# Patient Record
Sex: Female | Born: 1945
Health system: Southern US, Community
[De-identification: ages and names within clinical notes are randomized; demographics above are authoritative.]

## PROBLEM LIST (undated history)

## (undated) DIAGNOSIS — G588 Other specified mononeuropathies: Secondary | ICD-10-CM

## (undated) DIAGNOSIS — I10 Essential (primary) hypertension: Secondary | ICD-10-CM

## (undated) DIAGNOSIS — E785 Hyperlipidemia, unspecified: Secondary | ICD-10-CM

## (undated) DIAGNOSIS — T753XXA Motion sickness, initial encounter: Secondary | ICD-10-CM

## (undated) DIAGNOSIS — E079 Disorder of thyroid, unspecified: Secondary | ICD-10-CM

## (undated) DIAGNOSIS — K219 Gastro-esophageal reflux disease without esophagitis: Secondary | ICD-10-CM

## (undated) DIAGNOSIS — E7800 Pure hypercholesterolemia, unspecified: Secondary | ICD-10-CM

## (undated) HISTORY — DX: Essential (primary) hypertension: I10

## (undated) HISTORY — DX: Disorder of thyroid, unspecified: E07.9

## (undated) HISTORY — PX: BUNIONECTOMY: SHX129

## (undated) HISTORY — DX: Pure hypercholesterolemia, unspecified: E78.00

## (undated) HISTORY — DX: Other specified mononeuropathies: G58.8

## (undated) HISTORY — DX: Hyperlipidemia, unspecified: E78.5

## (undated) HISTORY — PX: COLONOSCOPY: SHX174

## (undated) HISTORY — PX: CHOLECYSTECTOMY: SHX55

## (undated) HISTORY — PX: ABDOMINAL HYSTERECTOMY: SHX81

---

## 2008-02-19 ENCOUNTER — Ambulatory Visit: Payer: Self-pay | Admitting: Gastroenterology

## 2009-07-26 ENCOUNTER — Encounter: Payer: Self-pay | Admitting: Family Medicine

## 2009-08-04 ENCOUNTER — Encounter: Payer: Self-pay | Admitting: Family Medicine

## 2009-08-10 ENCOUNTER — Ambulatory Visit: Payer: Self-pay | Admitting: Endocrinology

## 2010-01-05 ENCOUNTER — Ambulatory Visit: Payer: Self-pay | Admitting: Obstetrics & Gynecology

## 2010-01-06 ENCOUNTER — Encounter: Payer: Self-pay | Admitting: Obstetrics & Gynecology

## 2010-01-19 ENCOUNTER — Ambulatory Visit: Payer: Self-pay | Admitting: Obstetrics & Gynecology

## 2010-02-10 ENCOUNTER — Encounter: Payer: Self-pay | Admitting: Family Medicine

## 2010-02-15 ENCOUNTER — Encounter: Payer: Self-pay | Admitting: Family Medicine

## 2010-03-03 ENCOUNTER — Encounter: Admission: RE | Admit: 2010-03-03 | Discharge: 2010-03-03 | Payer: Self-pay | Admitting: Obstetrics & Gynecology

## 2010-05-11 ENCOUNTER — Ambulatory Visit: Payer: Self-pay | Admitting: Family Medicine

## 2010-05-11 DIAGNOSIS — G43909 Migraine, unspecified, not intractable, without status migrainosus: Secondary | ICD-10-CM | POA: Insufficient documentation

## 2010-05-11 DIAGNOSIS — E0590 Thyrotoxicosis, unspecified without thyrotoxic crisis or storm: Secondary | ICD-10-CM

## 2010-05-11 DIAGNOSIS — E785 Hyperlipidemia, unspecified: Secondary | ICD-10-CM | POA: Insufficient documentation

## 2010-05-11 DIAGNOSIS — I10 Essential (primary) hypertension: Secondary | ICD-10-CM | POA: Insufficient documentation

## 2010-05-11 DIAGNOSIS — N952 Postmenopausal atrophic vaginitis: Secondary | ICD-10-CM

## 2010-05-12 ENCOUNTER — Ambulatory Visit: Payer: Self-pay | Admitting: Family Medicine

## 2010-05-12 LAB — CONVERTED CEMR LAB: T3, Free: 3.4 pg/mL (ref 2.3–4.2)

## 2010-05-15 LAB — CONVERTED CEMR LAB
AST: 20 units/L (ref 0–37)
Albumin: 4 g/dL (ref 3.5–5.2)
BUN: 20 mg/dL (ref 6–23)
Calcium: 9.4 mg/dL (ref 8.4–10.5)
Cholesterol: 131 mg/dL (ref 0–200)
Creatinine, Ser: 0.9 mg/dL (ref 0.4–1.2)
Free T4: 1.6 ng/dL (ref 0.6–1.6)
GFR calc non Af Amer: 71.53 mL/min (ref >60)
Glucose, Bld: 88 mg/dL (ref 70–99)
HDL: 37.7 mg/dL — ABNORMAL LOW (ref >39.00)
LDL Cholesterol: 71 mg/dL (ref 0–99)
Potassium: 3.9 meq/L (ref 3.5–5.1)
TSH: 0.07 microintl units/mL — ABNORMAL LOW (ref 0.35–5.50)
Triglycerides: 111 mg/dL (ref 0.0–149.0)
VLDL: 22.2 mg/dL (ref 0.0–40.0)

## 2010-07-13 ENCOUNTER — Ambulatory Visit: Payer: Self-pay | Admitting: Obstetrics & Gynecology

## 2010-07-14 ENCOUNTER — Encounter: Payer: Self-pay | Admitting: Obstetrics & Gynecology

## 2010-07-14 LAB — CONVERTED CEMR LAB: Trich, Wet Prep: NONE SEEN

## 2010-09-14 ENCOUNTER — Ambulatory Visit: Payer: Self-pay | Admitting: Family Medicine

## 2010-09-14 DIAGNOSIS — R05 Cough: Secondary | ICD-10-CM

## 2010-09-15 ENCOUNTER — Encounter: Payer: Self-pay | Admitting: Family Medicine

## 2010-09-15 LAB — CONVERTED CEMR LAB
Free T4: 1.14 ng/dL (ref 0.60–1.60)
T3, Free: 2.6 pg/mL (ref 2.3–4.2)

## 2010-10-18 ENCOUNTER — Ambulatory Visit: Payer: Self-pay | Admitting: Family Medicine

## 2010-10-18 DIAGNOSIS — N63 Unspecified lump in unspecified breast: Secondary | ICD-10-CM | POA: Insufficient documentation

## 2010-10-18 DIAGNOSIS — J0110 Acute frontal sinusitis, unspecified: Secondary | ICD-10-CM | POA: Insufficient documentation

## 2010-10-24 ENCOUNTER — Encounter: Admission: RE | Admit: 2010-10-24 | Discharge: 2010-10-24 | Payer: Self-pay | Admitting: Family Medicine

## 2010-11-06 ENCOUNTER — Telehealth: Payer: Self-pay | Admitting: Family Medicine

## 2010-11-14 ENCOUNTER — Ambulatory Visit: Payer: Self-pay | Admitting: Family Medicine

## 2010-11-14 DIAGNOSIS — R079 Chest pain, unspecified: Secondary | ICD-10-CM

## 2011-01-02 NOTE — Assessment & Plan Note (Signed)
Summary: NEW PT TO EST/CLE   Vital Signs:  Patient profile:   65 year old female Height:      68 inches Weight:      182.38 pounds BMI:     27.83 Temp:     98.6 degrees F oral Pulse rate:   80 / minute Pulse rhythm:   regular BP sitting:   102 / 70  (left arm) Cuff size:   regular  Vitals Entered By: Linde Gillis CMA Duncan Dull) 2010-05-27 1:59 PM) CC: new patient   History of Present Illness: 65 yo here to establish care.  1.  Hypothyroidism- has been on Synthroid 137 micrograms daily for years.  Had lab work done in 01/2010, TSH was 0.179, T4 10.7.  Has felt a little warmer lately, no other symptoms.  Ordering MD did not adjust her synthroid dose.  2.  HLD- has been on Vytorin 10-40 mg.  Does have some mild myalgias, but could not tolerate others so ok with staying on current dose.  3.  HTN- has been on Spironolactone/HCTZ 25/25 for years.  No CP, SOB, LE edema or blurred vision.  4.  Migraine headaches- gets one per month, not typically assocaited with nausea, vomiting or photophobia.  Takes one Cafergot and it usually resolves.  5.  Vulvular itching- sees Dr. Marice Potter.  Placed on Vagifem 1 tablet twice weekly which has been helping.  Well woman- due for Zostavax.  UTD on all other prevention.  Preventive Screening-Counseling & Management  Caffeine-Diet-Exercise     Does Patient Exercise: yes      Drug Use:  no.    Current Medications (verified): 1)  Synthroid 137 Mcg Tabs (Levothyroxine Sodium) .... Take One Tablet By Mouth Daily 2)  Spironolactone-Hctz 25-25 Mg Tabs (Spironolactone-Hctz) .... Take Two Tablet By Mouth Once Daily 3)  Vytorin 10-40 Mg Tabs (Ezetimibe-Simvastatin) .... Take One Tablet By Mouth Once Daily 4)  Vagifem 10 Mcg Tabs (Estradiol) .... Take One Tablet By Mouth Once A Week 5)  Cafergot 1-100 Mg Tabs (Ergotamine-Caffeine) .... Take As Needed  Allergies (verified): 1)  ! Penicillin 2)  ! Sulfa 3)  ! Amoxicillin  Past History:  Family  History: Last updated: May 27, 2010 mother died of bone CA at 30 father died of CAD at 74 Family History of CAD Female 1st degree relative <50- sibblings  Social History: Last updated: 05/27/10 Retired, originally from [place]. No children. Married Regular exercise-yes Drug use-no  Risk Factors: Exercise: yes (05/27/10)  Past Medical History: Hyperlipidemia Hypertension Hyperthyroidism Migraine  Past Surgical History: Cholecystectomy Hysterectomy  Family History: Reviewed history and no changes required. mother died of bone CA at 40 father died of CAD at 8 Family History of CAD Female 1st degree relative <50- sibblings  Social History: Reviewed history and no changes required. Retired, originally from [place]. No children. Married Regular exercise-yes Drug use-no Does Patient Exercise:  yes Drug Use:  no  Review of Systems      See HPI General:  Denies fatigue and malaise. Eyes:  Denies blurring. ENT:  Denies difficulty swallowing. CV:  Denies bluish discoloration of lips or nails, chest pain or discomfort, and difficulty breathing at night. Resp:  Denies shortness of breath. GI:  Denies abdominal pain, bloody stools, and change in bowel habits. GU:  Denies abnormal vaginal bleeding and urinary frequency. MS:  Denies joint pain, joint redness, and joint swelling. Derm:  Denies rash. Neuro:  Denies headaches. Psych:  Denies anxiety and depression. Endo:  Complains  of heat intolerance. Heme:  Denies abnormal bruising.  Physical Exam  General:  Well-developed,well-nourished,in no acute distress; alert,appropriate and cooperative throughout examination Head:  Normocephalic and atraumatic without obvious abnormalities. No apparent alopecia or balding. Eyes:  vision grossly intact, pupils equal, and pupils round.   Ears:  R ear normal and L ear normal.   Nose:  no external deformity.   Mouth:  good dentition and no gingival abnormalities.   Neck:  No  deformities, masses, or tenderness noted. Lungs:  normal respiratory effort, no intercostal retractions, no accessory muscle use, no crackles, and no wheezes.   Heart:  normal rate, regular rhythm, and no murmur.   Abdomen:  Bowel sounds positive,abdomen soft and non-tender without masses, organomegaly or hernias noted. Msk:  No deformity or scoliosis noted of thoracic or lumbar spine.   Extremities:  No clubbing, cyanosis, edema, or deformity noted with normal full range of motion of all joints.   Neurologic:  alert & oriented X3.   Skin:  Intact without suspicious lesions or rashes Psych:  Cognition and judgment appear intact. Alert and cooperative with normal attention span and concentration. No apparent delusions, illusions, hallucinations   Impression & Recommendations:  Problem # 1:  HYPERTHYROIDISM (ICD-242.90) Assessment Deteriorated May need to decrease dose of Synthroid based on previous labs.  Will redraw TSH, T3, T4.  Problem # 2:  HYPERTENSION (ICD-401.9) Assessment: Unchanged Stable on current meds. Her updated medication list for this problem includes:    Spironolactone-hctz 25-25 Mg Tabs (Spironolactone-hctz) .Marland Kitchen... Take two tablet by mouth once daily  Problem # 3:  HYPERLIPIDEMIA (ICD-272.4) Assessment: Unchanged Recheck FLP today, may be able to decrease medication, perhaps remove Ezetimibe.  Changing lifestyle, has lost 13 pounds in 3 months. Her updated medication list for this problem includes:    Vytorin 10-40 Mg Tabs (Ezetimibe-simvastatin) .Marland Kitchen... Take one tablet by mouth once daily  Problem # 4:  VAGINITIS, ATROPHIC (ICD-627.3) Assessment: Unchanged Continue Vagifem per Dr. Marice Potter. Her updated medication list for this problem includes:    Vagifem 10 Mcg Tabs (Estradiol) .Marland Kitchen... Take one tablet by mouth once a week  Problem # 5:  MIGRAINE HEADACHE (ICD-346.90) Assessment: Unchanged Contine using Cafergot as abortive medicine only, does not currently require  migraine prophylaxis. Her updated medication list for this problem includes:    Cafergot 1-100 Mg Tabs (Ergotamine-caffeine) .Marland Kitchen... Take as needed  Complete Medication List: 1)  Synthroid 137 Mcg Tabs (Levothyroxine sodium) .... Take one tablet by mouth daily 2)  Spironolactone-hctz 25-25 Mg Tabs (Spironolactone-hctz) .... Take two tablet by mouth once daily 3)  Vytorin 10-40 Mg Tabs (Ezetimibe-simvastatin) .... Take one tablet by mouth once daily 4)  Vagifem 10 Mcg Tabs (Estradiol) .... Take one tablet by mouth once a week 5)  Cafergot 1-100 Mg Tabs (Ergotamine-caffeine) .... Take as needed  Patient Instructions: 1)  It was very nice to meet you, Ms. Susan Byrd. 2)  Please check with your insurance company to see if they cover Zostavax (shingles vaccine). 3)  Please make a fasting lab appointment tomorrow morning for TSH, T3, T4 (242.9), fasting lipiid, hepatic panel (272.4), BMET (401.9). Prescriptions: VYTORIN 10-40 MG TABS (EZETIMIBE-SIMVASTATIN) take one tablet by mouth once daily  #90 x 3   Entered and Authorized by:   Ruthe Mannan MD   Signed by:   Ruthe Mannan MD on 05/11/2010   Method used:   Print then Give to Patient   RxID:   [id number] VYTORIN 10-40 MG TABS (EZETIMIBE-SIMVASTATIN) take one tablet by  mouth once daily  #90 x 3   Entered and Authorized by:   Ruthe Mannan MD   Signed by:   Ruthe Mannan MD on 05/11/2010   Method used:   Electronically to        [facility] [phone]* (retail)       [address] [place], [place]  [address]       Ph: [phone] or [phone]       Fax: [phone]   RxID:   [id number]   Current Allergies (reviewed today): ! PENICILLIN ! SULFA ! AMOXICILLIN  Flex Sig Next Due:  Not Indicated Colonoscopy Result Date:  05/16/2007 Colonoscopy Result:  normal Colonoscopy Next Due:  10 yr Hemoccult Next Due:  Not Indicated PAP Next Due:  Not Indicated

## 2011-01-02 NOTE — Assessment & Plan Note (Signed)
Summary: ??sinus infection   Vital Signs:  Patient profile:   65 year old female Height:      68 inches Weight:      185 pounds BMI:     28.23 Temp:     98.6 degrees F oral Pulse rate:   64 / minute Pulse rhythm:   regular BP sitting:   112 / 82  (left arm) Cuff size:   regular  Vitals Entered By: Linde Gillis CMA Duncan Dull) (October 18, 2010 9:34 AM) CC: ? sinus infections, cough   History of Present Illness: 65 yo here for 2 week of progressive URI symptoms.  STarted with sneezing, runny nose, now has productive cough and left sided facial pressure. No fevers or chills. Dayquil not helping. No SOB.  Breast mass- felt a palpable mass in left breast several month ago, still there. Not growing in size that she is aware of but is sometimes painful to touch. Mammogram was normal in 03/2010.   No nipple changes or discharge. No night sweats or fevers.  Current Medications (verified): 1)  Spironolactone-Hctz 25-25 Mg Tabs (Spironolactone-Hctz) .... Take Two Tablet By Mouth Once Daily 2)  Vytorin 10-40 Mg Tabs (Ezetimibe-Simvastatin) .... Take One Tablet By Mouth Once Daily 3)  Vagifem 10 Mcg Tabs (Estradiol) .... Take One Tablet By Mouth Once A Week 4)  Cafergot 1-100 Mg Tabs (Ergotamine-Caffeine) .... 2 Tabs As Needed At Onset of Headache 5)  Synthroid 125 Mcg Tabs (Levothyroxine Sodium) .Marland Kitchen.. 1 Tab By Mouth Daily. 6)  Tessalon Perles 100 Mg  Caps (Benzonatate) .Marland Kitchen.. 1 Tab By Mouth Three Times A Day As Needed Cough 7)  Azithromycin 250 Mg  Tabs (Azithromycin) .... 2 By  Mouth Today and Then 1 Daily For 4 Days  Allergies: 1)  ! Penicillin 2)  ! Sulfa 3)  ! Amoxicillin  Past History:  Past Medical History: Last updated: 05-12-2010 Hyperlipidemia Hypertension Hyperthyroidism Migraine  Past Surgical History: Last updated: 2010-05-12 Cholecystectomy Hysterectomy  Family History: Last updated: 05/12/10 mother died of bone CA at 59 father died of CAD at 67 Family  History of CAD Female 1st degree relative <50- sibblings  Social History: Last updated: 05-12-10 Retired, originally from [place]. No children. Married Regular exercise-yes Drug use-no  Risk Factors: Exercise: yes (05-12-10)  Review of Systems      See HPI General:  Denies loss of appetite, malaise, weakness, and weight loss. ENT:  Complains of earache, nasal congestion, postnasal drainage, sinus pressure, and sore throat. CV:  Denies chest pain or discomfort. Resp:  Complains of cough and sputum productive; denies shortness of breath and wheezing.  Physical Exam  General:  Well-developed,well-nourished,in no acute distress; alert,appropriate and cooperative throughout examination VSS, non toxic appearing Ears:  TMs retracted bilaterally. Nose:  no airflow obstruction and mucosal erythema.   Left frontal sinuses TTP Mouth:  +PND Breasts:  skin/areolae normal and no abnormal thickening.   palpable 2-3 cm mass at 3 oclock, left breast Lungs:  normal respiratory effort, no intercostal retractions, no accessory muscle use, no crackles, and no wheezes.   Heart:  normal rate, regular rhythm, and no murmur.   Extremities:  No clubbing, cyanosis, edema, or deformity noted with normal full range of motion of all joints.   Psych:  Cognition and judgment appear intact. Alert and cooperative with normal attention span and concentration. No apparent delusions, illusions, hallucinations   Impression & Recommendations:  Problem # 1:  ACUTE FRONTAL SINUSITIS (ICD-461.1) Assessment New Given duration and progression of  symptoms, will treat with Zpack. Tessalon as needed cough. Her updated medication list for this problem includes:    Tessalon Perles 100 Mg Caps (Benzonatate) .Marland Kitchen... 1 tab by mouth three times a day as needed cough    Azithromycin 250 Mg Tabs (Azithromycin) .Marland Kitchen... 2 by  mouth today and then 1 daily for 4 days  Problem # 2:  LUMP OR MASS IN BREAST  (ZOX-096.04) Assessment: New Refer for diagnostic mammogram to evaluate further. Orders: Radiology Referral (Radiology)  Complete Medication List: 1)  Spironolactone-hctz 25-25 Mg Tabs (Spironolactone-hctz) .... Take two tablet by mouth once daily 2)  Vytorin 10-40 Mg Tabs (Ezetimibe-simvastatin) .... Take one tablet by mouth once daily 3)  Vagifem 10 Mcg Tabs (Estradiol) .... Take one tablet by mouth once a week 4)  Cafergot 1-100 Mg Tabs (Ergotamine-caffeine) .... 2 tabs as needed at onset of headache 5)  Synthroid 125 Mcg Tabs (Levothyroxine sodium) .Marland Kitchen.. 1 tab by mouth daily. 6)  Tessalon Perles 100 Mg Caps (Benzonatate) .Marland Kitchen.. 1 tab by mouth three times a day as needed cough 7)  Azithromycin 250 Mg Tabs (Azithromycin) .... 2 by  mouth today and then 1 daily for 4 days  Patient Instructions: 1)  Please stop by to see Shirlee Limerick on your way out. 2)  Get plenty of rest, drink lots of clear liquids, and use Tylenol or Ibuprofen for fever and comfort. Zpack as prescribed.  Tessalon as needed for cough, sudafed can help with ear pressure. Return in 7-10 days if you're not better: sooner if you'er feeling worse.  Prescriptions: AZITHROMYCIN 250 MG  TABS (AZITHROMYCIN) 2 by  mouth today and then 1 daily for 4 days  #6 x 0   Entered and Authorized by:   Ruthe Mannan MD   Signed by:   Ruthe Mannan MD on 10/18/2010   Method used:   Print then Give to Patient   RxID:   [id number] CAFERGOT 1-100 MG TABS (ERGOTAMINE-CAFFEINE) 2 tabs as needed at onset of headache  #60 x 3   Entered and Authorized by:   Ruthe Mannan MD   Signed by:   Ruthe Mannan MD on 10/18/2010   Method used:   Print then Give to Patient   RxID:   [id number] TESSALON PERLES 100 MG  CAPS (BENZONATATE) 1 tab by mouth three times a day as needed cough  #30 x 0   Entered and Authorized by:   Ruthe Mannan MD   Signed by:   Ruthe Mannan MD on 10/18/2010   Method used:   [facility] to        [facility] [phone]* (retail)        [address] [place], [place]  [address]       Ph: [phone] or [phone]       Fax: [phone]   RxID:   [id number]    Orders Added: 1)  Radiology Referral [Radiology] 2)  Est. Patient Level IV [[address]]    Current Allergies (reviewed today): ! PENICILLIN ! SULFA ! AMOXICILLIN

## 2011-01-02 NOTE — Progress Notes (Signed)
Summary: Alternative for Cafergot  Phone Note From Pharmacy Call back at [phone] option 3 fax# [phone]   Caller: [facility] Call For: Dr. Dayton Martes  Summary of Call: Received fax form from pharmacy stating that Cafergot is discontinued and all similar products are off the market.  Called patient and left a message on machine at home for her to return call.  Dr. Dayton Martes wants to know what other medications she has tried and what has worked well for her in the past.  Linde Gillis CMA Duncan Dull)  November 06, 2010 3:10 PM   Left message on machine at home for patient to return call.  Linde Gillis CMA Duncan Dull)  November 07, 2010 11:40 AM   Patient advised as instructed via telephone.  She agrees to try something that along the same lines of Cafergot if possible but is willing to try anything. Initial call taken by: Linde Gillis CMA Duncan Dull),  November 07, 2010 3:39 PM  Follow-up for Phone Call        there is nothing similar on the market.  we would need to go to something like imitrex. Ruthe Mannan MD  November 08, 2010 7:21 AM  Patient is willing to try any other medication.  Linde Gillis CMA Duncan Dull)  November 08, 2010 7:43 AM   Rx called to [facility] and patient notified via message left on machine at home number. Follow-up by: Linde Gillis CMA Duncan Dull),  November 08, 2010 8:37 AM    New/Updated Medications: IMITREX 100 MG TABS (SUMATRIPTAN SUCCINATE) 1 by mouth at the onset of migraine. May repeat dose in one hour if headache not resolved Prescriptions: IMITREX 100 MG TABS (SUMATRIPTAN SUCCINATE) 1 by mouth at the onset of migraine. May repeat dose in one hour if headache not resolved  #10 x 3   Entered and Authorized by:   Ruthe Mannan MD   Signed by:   Ruthe Mannan MD on 11/08/2010   Method used:   Telephoned to ...         RxID:   [id number]

## 2011-01-02 NOTE — Assessment & Plan Note (Signed)
Summary: COUGH/FLU SHOT/SHINGLES SHOT/LAB WORK/DLO   Vital Signs:  Patient profile:   65 year old female Height:      68 inches Weight:      184 pounds BMI:     28.08 Temp:     97.8 degrees F oral Pulse rate:   60 / minute Pulse rhythm:   regular BP sitting:   102 / 80  (left arm) Cuff size:   regular  Vitals Entered By: Linde Gillis CMA Duncan Dull) (September 14, 2010 8:10 AM) CC: cough, ? flu shot, ? shingles shot   History of Present Illness: 65 yo here for follow up.   1.  Hypothyroidism- decreased her synthroid to 125 micrograms daily in 05/2010.  Feels better.  Denies any symptoms of hypo or hyperthyroidism.  2.  Cough- had a productive cough last week.  At one point, coughed so hard that she had chest pain that radiated to her jaw and eye.  Resolved 10 minutes later.  No residual deficits.  One of her friends told her that it might have been trigeminal neuralgia.  She does not hat have a runny nose, fever, sore throat, or ear pain.  She would like her shingles and flu shots today.  Current Medications (verified): 1)  Spironolactone-Hctz 25-25 Mg Tabs (Spironolactone-Hctz) .... Take Two Tablet By Mouth Once Daily 2)  Vytorin 10-40 Mg Tabs (Ezetimibe-Simvastatin) .... Take One Tablet By Mouth Once Daily 3)  Vagifem 10 Mcg Tabs (Estradiol) .... Take One Tablet By Mouth Once A Week 4)  Cafergot 1-100 Mg Tabs (Ergotamine-Caffeine) .... Take As Needed 5)  Synthroid 125 Mcg Tabs (Levothyroxine Sodium) .Marland Kitchen.. 1 Tab By Mouth Daily.  Allergies: 1)  ! Penicillin 2)  ! Sulfa 3)  ! Amoxicillin  Past History:  Past Medical History: Last updated: 05/21/2010 Hyperlipidemia Hypertension Hyperthyroidism Migraine  Past Surgical History: Last updated: 05/21/2010 Cholecystectomy Hysterectomy  Family History: Last updated: May 21, 2010 mother died of bone CA at 56 father died of CAD at 69 Family History of CAD Female 1st degree relative <50- sibblings  Social History: Last updated:  05/21/2010 Retired, originally from [place]. No children. Married Regular exercise-yes Drug use-no  Risk Factors: Exercise: yes (05-21-2010)  Review of Systems      See HPI General:  Denies fever. CV:  Denies chest pain or discomfort. Resp:  Complains of cough; denies wheezing. Endo:  Denies cold intolerance, heat intolerance, and weight change.  Physical Exam  General:  Well-developed,well-nourished,in no acute distress; alert,appropriate and cooperative throughout examination Head:  Normocephalic and atraumatic without obvious abnormalities. No apparent alopecia or balding. Eyes:  vision grossly intact, pupils equal, and pupils round.   Ears:  R ear normal and L ear normal.   Nose:  no external deformity.   Mouth:  good dentition and no gingival abnormalities.   Neck:  No deformities, masses, or tenderness noted. Lungs:  normal respiratory effort, no intercostal retractions, no accessory muscle use, no crackles, and no wheezes.   Heart:  normal rate, regular rhythm, and no murmur.   Abdomen:  Bowel sounds positive,abdomen soft and non-tender without masses, organomegaly or hernias noted. Msk:  No deformity or scoliosis noted of thoracic or lumbar spine.   Extremities:  No clubbing, cyanosis, edema, or deformity noted with normal full range of motion of all joints.   Neurologic:  alert & oriented X3.   Skin:  Intact without suspicious lesions or rashes Psych:  Cognition and judgment appear intact. Alert and cooperative with normal attention span and  concentration. No apparent delusions, illusions, hallucinations   Impression & Recommendations:  Problem # 1:  HYPERTHYROIDISM (ICD-242.90) Assessment Unchanged recheck thyroid panel today. Orders: Venipuncture (19147) TLB-TSH (Thyroid Stimulating Hormone) (84443-TSH) TLB-T4 (Thyrox), Free [phone]) TLB-T3, Free (Triiodothyronine) (84481-T3FREE)  Problem # 2:  COUGH (ICD-786.2) Assessment: New Resolving, likely  viral.  Lung exam clear.  Reassured her that I do not think she has Trigeminal neuralgia.  Perhaps had spams of SCM muscle group?  Advised to let me know if symptoms return.  Complete Medication List: 1)  Spironolactone-hctz 25-25 Mg Tabs (Spironolactone-hctz) .... Take two tablet by mouth once daily 2)  Vytorin 10-40 Mg Tabs (Ezetimibe-simvastatin) .... Take one tablet by mouth once daily 3)  Vagifem 10 Mcg Tabs (Estradiol) .... Take one tablet by mouth once a week 4)  Cafergot 1-100 Mg Tabs (Ergotamine-caffeine) .... Take as needed 5)  Synthroid 125 Mcg Tabs (Levothyroxine sodium) .Marland Kitchen.. 1 tab by mouth daily.  Current Allergies (reviewed today): ! PENICILLIN ! SULFA ! AMOXICILLIN  Appended Document: COUGH/FLU SHOT/SHINGLES SHOT/LAB WORK/DLO     Allergies: 1)  ! Penicillin 2)  ! Sulfa 3)  ! Amoxicillin   Complete Medication List: 1)  Spironolactone-hctz 25-25 Mg Tabs (Spironolactone-hctz) .... Take two tablet by mouth once daily 2)  Vytorin 10-40 Mg Tabs (Ezetimibe-simvastatin) .... Take one tablet by mouth once daily 3)  Vagifem 10 Mcg Tabs (Estradiol) .... Take one tablet by mouth once a week 4)  Cafergot 1-100 Mg Tabs (Ergotamine-caffeine) .... Take as needed 5)  Synthroid 125 Mcg Tabs (Levothyroxine sodium) .Marland Kitchen.. 1 tab by mouth daily.  Other Orders: Admin 1st Vaccine (86578) Flu Vaccine 49yrs + [phone]) Zoster (Shingles) Vaccine Live [phone]) Admin of Any Addtl Vaccine (13244)  Flu Vaccine Consent Questions     Do you have a history of severe allergic reactions to this vaccine? no    Any prior history of allergic reactions to egg and/or gelatin? no    Do you have a sensitivity to the preservative Thimersol? no    Do you have a past history of Guillan-Barre Syndrome? no    Do you currently have an acute febrile illness? no    Have you ever had a severe reaction to latex? no    Vaccine information given and explained to patient? yes    Are you currently pregnant? no    Lot  Number:[id number]   Exp Date:06/02/2011   Site Given  Left Deltoid IMlu    Immunizations Administered:  Zostavax # 1:    Vaccine Type: Zostavax    Site: left deltoid    Mfr: Merck    Dose: 0.97ml    Route: [place]    Given by: Linde Gillis CMA (AAMA)    Exp. Date: 07/21/2011    Lot #: [id number]    VIS given: 09/14/05 given September 14, 2010.

## 2011-01-02 NOTE — Letter (Signed)
Summary: French Ana MD  French Ana MD   Imported By: Lester [place] 05/22/2010 12:36:28  _____________________________________________________________________  External Attachment:    Type:   Image     Comment:   External Document

## 2011-01-02 NOTE — Letter (Signed)
Summary: Generic Letter   at [facility]  [address] [place], [place] [address]   Phone: [phone]  Fax: [phone]    09/15/2010  [facility] [address] [place], [place]  [address]  Dear Ms. Chaffin,    We have received your lab results back and Dr. Dayton Martes says that your thyroid studies are normal.  Enclosed is a copy of your lab results.       Sincerely,        Linde Gillis CMA (AAMA)for Dr. Ruthe Mannan

## 2011-01-04 NOTE — Assessment & Plan Note (Signed)
Summary: fell last Wednesday, pain in sternum area that comes and goes   Vital Signs:  Patient profile:   65 year old female Height:      68 inches Weight:      186 pounds BMI:     28.38 Temp:     98.6 degrees F oral Pulse rate:   68 / minute Pulse rhythm:   regular BP sitting:   122 / 70  (right arm) Cuff size:   regular  Vitals Entered By: Linde Gillis CMA Duncan Dull) (November 14, 2010 12:12 PM) CC: pain in sternum area from fall   History of Present Illness: 65 yo here for sternal and rib pain.  Slipped on wet step on [place], fell and landed on her bottom. A few minutes later, felt chest soreness. Gradually getting better but hurts when she takes deep breaths. Started in mid chest, now more under breasts bilaterally. No SOB.  No LOC although she did hit her head.  Current Medications (verified): 1)  Spironolactone-Hctz 25-25 Mg Tabs (Spironolactone-Hctz) .... Take Two Tablet By Mouth Once Daily 2)  Vytorin 10-40 Mg Tabs (Ezetimibe-Simvastatin) .... Take One Tablet By Mouth Once Daily 3)  Vagifem 10 Mcg Tabs (Estradiol) .... Take One Tablet By Mouth Once A Week 4)  Cafergot 1-100 Mg Tabs (Ergotamine-Caffeine) .... 2 Tabs As Needed At Onset of Headache 5)  Synthroid 125 Mcg Tabs (Levothyroxine Sodium) .Marland Kitchen.. 1 Tab By Mouth Daily. 6)  Tessalon Perles 100 Mg  Caps (Benzonatate) .Marland Kitchen.. 1 Tab By Mouth Three Times A Day As Needed Cough 7)  Imitrex 100 Mg Tabs (Sumatriptan Succinate) .Marland Kitchen.. 1 By Mouth At The Onset of Migraine. May Repeat Dose in One Hour If Headache Not Resolved  Allergies: 1)  ! Penicillin 2)  ! Sulfa 3)  ! Amoxicillin  Past History:  Past Medical History: Last updated: 2010/05/25 Hyperlipidemia Hypertension Hyperthyroidism Migraine  Past Surgical History: Last updated: 05-25-2010 Cholecystectomy Hysterectomy  Family History: Last updated: 05/25/2010 mother died of bone CA at 37 father died of CAD at 68 Family History of CAD Female 1st degree relative <50-  sibblings  Social History: Last updated: 05/25/10 Retired, originally from [place]. No children. Married Regular exercise-yes Drug use-no  Risk Factors: Exercise: yes (May 25, 2010)  Review of Systems      See HPI CV:  Denies near fainting, palpitations, and shortness of breath with exertion. Resp:  Denies shortness of breath.  Physical Exam  General:  Well-developed,well-nourished,in no acute distress; alert,appropriate and cooperative throughout examination VSS, non toxic appearing Chest Wall:  TTP over bilateral inferior rib cage, costochondrial tenderness.   Lungs:  normal respiratory effort, no intercostal retractions, no accessory muscle use, no crackles, and no wheezes.   Heart:  normal rate, regular rhythm, and no murmur.   Psych:  Cognition and judgment appear intact. Alert and cooperative with normal attention span and concentration. No apparent delusions, illusions, hallucinations   Impression & Recommendations:  Problem # 1:  RIB PAIN (ICD-786.50) Assessment New Seems most consistent with costochondriitis. Will get xray to rule out rib fractures. Continue supportive care as per pt instructions. Orders: T-Ribs Bilateral 4 Views [phone])  Complete Medication List: 1)  Spironolactone-hctz 25-25 Mg Tabs (Spironolactone-hctz) .... Take two tablet by mouth once daily 2)  Vytorin 10-40 Mg Tabs (Ezetimibe-simvastatin) .... Take one tablet by mouth once daily 3)  Vagifem 10 Mcg Tabs (Estradiol) .... Take one tablet by mouth once a week 4)  Cafergot 1-100 Mg Tabs (Ergotamine-caffeine) .... 2 tabs as needed  at onset of headache 5)  Synthroid 125 Mcg Tabs (Levothyroxine sodium) .Marland Kitchen.. 1 tab by mouth daily. 6)  Tessalon Perles 100 Mg Caps (Benzonatate) .Marland Kitchen.. 1 tab by mouth three times a day as needed cough 7)  Imitrex 100 Mg Tabs (Sumatriptan succinate) .Marland Kitchen.. 1 by mouth at the onset of migraine. may repeat dose in one hour if headache not resolved  Patient  Instructions: 1)  You can take up to 800 mg of Advil three times a day. 2)  Take 650 - 1000 mg of tylenol every 4-6 hours as needed for relief of pain or comfort. Avoid taking more than 4000 mg in a 24 hour period( can cause liver damage in higher doses).    Orders Added: 1)  T-Ribs Bilateral 4 Views [71111TC] 2)  Est. Patient Level IV [04540]    Current Allergies (reviewed today): ! PENICILLIN ! SULFA ! AMOXICILLIN

## 2011-03-06 ENCOUNTER — Other Ambulatory Visit: Payer: Self-pay | Admitting: *Deleted

## 2011-03-06 MED ORDER — SPIRONOLACTONE-HCTZ 25-25 MG PO TABS: ORAL_TABLET | ORAL | Status: DC

## 2011-03-06 NOTE — Telephone Encounter (Signed)
In my box

## 2011-03-06 NOTE — Telephone Encounter (Signed)
Order faxed to [facility] at [phone].

## 2011-03-06 NOTE — Telephone Encounter (Signed)
Pt needs order for labs to be faxed to [facility], she has an appt to see next week week, so needs this as soon as possible.

## 2011-03-08 ENCOUNTER — Encounter: Payer: Self-pay | Admitting: Family Medicine

## 2011-03-08 LAB — HM SIGMOIDOSCOPY

## 2011-03-08 LAB — HM COLONOSCOPY

## 2011-03-08 LAB — HM PAP SMEAR

## 2011-03-13 ENCOUNTER — Ambulatory Visit (INDEPENDENT_AMBULATORY_CARE_PROVIDER_SITE_OTHER): Payer: PRIVATE HEALTH INSURANCE | Admitting: Family Medicine

## 2011-03-13 ENCOUNTER — Encounter: Payer: Self-pay | Admitting: Family Medicine

## 2011-03-13 DIAGNOSIS — E0590 Thyrotoxicosis, unspecified without thyrotoxic crisis or storm: Secondary | ICD-10-CM

## 2011-03-13 DIAGNOSIS — Z1211 Encounter for screening for malignant neoplasm of colon: Secondary | ICD-10-CM

## 2011-03-13 DIAGNOSIS — Z1231 Encounter for screening mammogram for malignant neoplasm of breast: Secondary | ICD-10-CM

## 2011-03-13 DIAGNOSIS — Z0000 Encounter for general adult medical examination without abnormal findings: Secondary | ICD-10-CM

## 2011-03-13 MED ORDER — EZETIMIBE-SIMVASTATIN 10-40 MG PO TABS: 1.0000 | ORAL_TABLET | Freq: Every day | ORAL | Status: DC

## 2011-03-13 MED ORDER — SPIRONOLACTONE-HCTZ 25-25 MG PO TABS: ORAL_TABLET | ORAL | Status: DC

## 2011-03-13 MED ORDER — LEVOTHYROXINE SODIUM 112 MCG PO TABS: 112.0000 ug | ORAL_TABLET | Freq: Every day | ORAL | Status: DC

## 2011-03-13 NOTE — Assessment & Plan Note (Signed)
Reviewed preventive care protocols, scheduled due services, and updated immunizations Discussed nutrition, exercise, diet, and healthy lifestyle.  IFOB ordered today. Mammogram ordered today. Labs reviewed with pt ([facility]).

## 2011-03-13 NOTE — Assessment & Plan Note (Signed)
Deteriorated. Decreased synthroid to 112 mcg daily. Follow up TSH in 8 weeks.

## 2011-03-13 NOTE — Progress Notes (Signed)
65 yo here for CPX.  1.  Hypothyroidism- has been on Synthroid daily since last year.    Had lab work done in this week,  TSH was 0.241.  Has felt a little warmer lately, no other symptoms.    2.  HLD- has been on Vytorin 10-40 mg.  Lipid panel excellent- LDL 70, HDL 51, TG 101.    3.  HTN- has been on Spironolactone/HCTZ 25/25 for years.  No CP, SOB, LE edema or blurred vision.  Well woman- due for mammogram.  The PMH, PSH, Social History, Family History, Medications, and allergies have been reviewed in [facility], and have been updated if relevant.   Review of Systems       See HPI General:  Denies fatigue and malaise. Eyes:  Denies blurring. ENT:  Denies difficulty swallowing. CV:  Denies bluish discoloration of lips or nails, chest pain or discomfort, and difficulty breathing at night. Resp:  Denies shortness of breath. GI:  Denies abdominal pain, bloody stools, and change in bowel habits. GU:  Denies abnormal vaginal bleeding and urinary frequency. MS:  Denies joint pain, joint redness, and joint swelling. Derm:  Denies rash. Neuro:  Denies headaches. Psych:  Denies anxiety and depression. Endo:  Complains of heat intolerance. Heme:  Denies abnormal bruising.  Physical Exam BP 128/80  Pulse 57  Temp(Src) 97.9 F (36.6 C) (Oral)  Ht 5\' 8"  (1.727 m)  Wt 186 lb 12.8 oz (84.732 kg)  BMI 28.40 kg/m2  General:  Well-developed,well-nourished,in no acute distress; alert,appropriate and cooperative throughout examination Head:  Normocephalic and atraumatic without obvious abnormalities. No apparent alopecia or balding. Eyes:  vision grossly intact, pupils equal, and pupils round.   Ears:  R ear normal and L ear normal.   Nose:  no external deformity.   Mouth:  good dentition and no gingival abnormalities.   Neck:  No deformities, masses, or tenderness noted. Lungs:  normal respiratory effort, no intercostal retractions, no accessory muscle use, no crackles, and no  wheezes.   Heart:  normal rate, regular rhythm, and no murmur.   Abdomen:  Bowel sounds positive,abdomen soft and non-tender without masses, organomegaly or hernias noted. Msk:  No deformity or scoliosis noted of thoracic or lumbar spine.   Extremities:  No clubbing, cyanosis, edema, or deformity noted with normal full range of motion of all joints.   Neurologic:  alert & oriented X3.   Skin:  Intact without suspicious lesions or rashes Psych:  Cognition and judgment appear intact. Alert and cooperative with normal attention span and concentration. No apparent delusions, illusions, hallucinations

## 2011-03-13 NOTE — Patient Instructions (Signed)
Good to see you. Please stop by to see Marion on your way out. 

## 2011-03-14 ENCOUNTER — Encounter: Payer: Self-pay | Admitting: Family Medicine

## 2011-03-14 ENCOUNTER — Telehealth: Payer: Self-pay | Admitting: *Deleted

## 2011-03-14 NOTE — Telephone Encounter (Signed)
Pt states she has always taken brand name synthroid, because that is what her previous doctor wanted her on.  When she picked up her new script yesterday it was for generic, and she is asking if that is ok.  Per Dr. Dayton Martes, advised yes, there are a lot of people on the generics.  Dr. Dayton Martes said she would just continue to watch the pt and see how she does with it.

## 2011-03-21 ENCOUNTER — Other Ambulatory Visit: Payer: Self-pay | Admitting: Family Medicine

## 2011-03-26 ENCOUNTER — Ambulatory Visit
Admission: RE | Admit: 2011-03-26 | Discharge: 2011-03-26 | Disposition: A | Discharge status: Home / Self Care | Payer: PRIVATE HEALTH INSURANCE | Source: Ambulatory Visit | Attending: Family Medicine | Admitting: Family Medicine

## 2011-03-26 ENCOUNTER — Other Ambulatory Visit: Payer: Self-pay | Admitting: Family Medicine

## 2011-03-26 ENCOUNTER — Encounter: Payer: Self-pay | Admitting: *Deleted

## 2011-03-26 DIAGNOSIS — Z1231 Encounter for screening mammogram for malignant neoplasm of breast: Secondary | ICD-10-CM

## 2011-04-05 NOTE — Progress Notes (Signed)
Addended byMills Koller on: 04/05/2011 11:11 AM   Modules accepted: Orders

## 2011-04-17 NOTE — Assessment & Plan Note (Signed)
Susan Byrd, Susan Byrd             ACCOUNT NO.:  [id number]   MEDICAL RECORD NO.:  [id number]          PATIENT TYPE:  POB   LOCATION:  [facility] at [facility]         FACILITY:  [facility]   PHYSICIAN:  Allie Bossier, MD        DATE OF BIRTH:  1946/09/06   DATE OF SERVICE:                                  CLINIC NOTE   Susan Byrd is a 65 year old lady, who is seen here on January 05, 2010,  with the complaint of a vaginal discharge and states that she has been  tried on Flagyl for vaginal infections.  I did a wet prep, it came back  with negative findings except too numerous to count white blood cells.   On exam; when I saw her 2 weeks ago, there was significant atrophy of  the vulva and vagina.  A slightly malodorous greenish discharge on her  labia and there was punctation around the cuff in the vagina.  I gave  her Vagifem 10 mcg nightly for 2 weeks.  She says in the last week the  discharge has abated greatly, but she still has some chronic itching.  She considers this not to be very significant.  On exam, the punctations  have resolved.  Her atrophy is still present, but decidedly improved and  there is no more greenish discharge anywhere to be found.   ASSESSMENT AND PLAN:  Improvement of symptoms.  I will recommend now  that she switch the Vagifem to twice weekly.  We will schedule her  mammogram for when it is due in March and she will come back as  necessary.      Allie Bossier, MD     MCD/MEDQ  D:  01/19/2010  T:  01/19/2010  Job:  [id number]

## 2011-04-17 NOTE — Assessment & Plan Note (Signed)
Susan Byrd, Susan Byrd             ACCOUNT NO.:  [id number]   MEDICAL RECORD NO.:  [id number]          PATIENT TYPE:  POB   LOCATION:  [facility] at [facility]         FACILITY:  [facility]   PHYSICIAN:  Allie Bossier, MD        DATE OF BIRTH:  1946-11-23   DATE OF SERVICE:  07/13/2010                                  CLINIC NOTE   Susan Byrd is a 65 year old lady who has had about an 42-month history  of atrophic vaginitis.  She was started on Vagifem in February of this  year and got significant improvement, but some perineal itchiness has  recurred and a clear discharge.  On exam, there was vast improvement  from her previous atrophy.  The vault appears normal.  I did do a wet  prep and the discharge appears normal.  There was a moderate amount of  erythema right at the vulvar and vaginal introitus and after prepping it  with Betadine and numbing area with 1% lidocaine, I did a 2-mm punch  biopsy and cauterized the area with silver nitrate.  She tolerated the  procedure well.  I have recommended she continue her Vagifem and I will  notify her if the biopsy or the wet prep are abnormal.  She also  discussed night sweats since she has discontinued p.o. of her systemic  estrogen.  However, she does not want to restart estrogen for SSRIs for  the relief.      Allie Bossier, MD     MCD/MEDQ  D:  07/13/2010  T:  07/13/2010  Job:  [id number]

## 2011-04-17 NOTE — Assessment & Plan Note (Signed)
NAMETAMEYA, KUZNIA             ACCOUNT NO.:  [id number]   MEDICAL RECORD NO.:  [id number]          PATIENT TYPE:  POB   LOCATION:  [facility]         FACILITY:  [facility]   PHYSICIAN:  Allie Bossier, MD        DATE OF BIRTH:  May 02, 1946   DATE OF SERVICE:                                  CLINIC NOTE   Ms. Donegan is a 65 year old married white gravida 1, para 0, abortus 1  who comes here as a new patient.  She complains of a several-month  history of a vaginal discharge that she says varies from being clear to  yellowish to brownish.  She states that she was treated in [place]  with Flagyl approximately 2 months ago.  This temporarily relieved the  issue.  She then came back home to [place], experienced something  similar and tried Monistat.  She is not sure if that helped it or not.  She does not use vaginal estrogen.  Her uterus has been removed.  She is  essentially abstinent due to her husband's fight against prostate cancer  at the current time.   On physical exam, she has significant atrophy and a mildly malodorous  greenish discharge on her labia minora and in the vault.  The vaginal  cuff has some punctation and appears to be generally inflamed.  I am  sending a wet prep to the lab.  I will go ahead and treat for atrophic  vaginitis with 10 mcg of Vagifem per vagina nightly x2 weeks.  I will  also give her prescription for Flagyl 500 mg b.i.d. for 5 days.  She  will come back in 1-2 weeks if the punctation of the cuff is still  there.  I have advised her that I will recommend a biopsy of the vaginal  cuff.  She had no questions.      Allie Bossier, MD     MCD/MEDQ  D:  01/05/2010  T:  01/06/2010  Job:  [id number]

## 2011-04-25 ENCOUNTER — Encounter: Payer: Self-pay | Admitting: *Deleted

## 2011-04-25 ENCOUNTER — Other Ambulatory Visit (INDEPENDENT_AMBULATORY_CARE_PROVIDER_SITE_OTHER): Payer: Medicare Other | Admitting: Family Medicine

## 2011-04-25 ENCOUNTER — Other Ambulatory Visit: Payer: Medicare Other

## 2011-04-25 DIAGNOSIS — Z1211 Encounter for screening for malignant neoplasm of colon: Secondary | ICD-10-CM

## 2011-06-12 ENCOUNTER — Encounter: Payer: Self-pay | Admitting: Family Medicine

## 2011-06-12 ENCOUNTER — Other Ambulatory Visit: Payer: Self-pay | Admitting: *Deleted

## 2011-06-12 MED ORDER — LEVOTHYROXINE SODIUM 100 MCG PO TABS: 100.0000 ug | ORAL_TABLET | Freq: Every day | ORAL | Status: DC

## 2011-06-12 NOTE — Telephone Encounter (Signed)
In my box

## 2011-06-12 NOTE — Telephone Encounter (Signed)
Patient would like an order to be mailed to her home address so she can get her TSH drawn at [facility].

## 2011-06-12 NOTE — Telephone Encounter (Signed)
Order for TSH to be drawn at [facility] mailed to patients home address.

## 2011-06-26 ENCOUNTER — Other Ambulatory Visit: Payer: Self-pay | Admitting: *Deleted

## 2011-06-26 MED ORDER — ESTRADIOL 10 MCG VA TABS: 1.0000 | ORAL_TABLET | VAGINAL | Status: DC

## 2011-06-26 NOTE — Telephone Encounter (Signed)
Received faxed request for nex Rx, with 90 day supply and refills.  Please advise.

## 2011-07-13 ENCOUNTER — Other Ambulatory Visit: Payer: Self-pay | Admitting: *Deleted

## 2011-07-13 NOTE — Telephone Encounter (Signed)
Received fax from [facility] regarding patients Vagifem Rx.  Form is in your IN box.

## 2011-07-16 MED ORDER — ESTRADIOL 10 MCG VA TABS: ORAL_TABLET | VAGINAL | Status: DC

## 2011-08-13 ENCOUNTER — Other Ambulatory Visit: Payer: Self-pay | Admitting: Gynecology

## 2011-08-13 DIAGNOSIS — N952 Postmenopausal atrophic vaginitis: Secondary | ICD-10-CM

## 2011-08-13 MED ORDER — ESTRADIOL 10 MCG VA TABS: ORAL_TABLET | VAGINAL | Status: DC

## 2011-08-14 ENCOUNTER — Other Ambulatory Visit: Payer: Self-pay | Admitting: Family Medicine

## 2011-08-27 ENCOUNTER — Ambulatory Visit: Payer: Medicare Other | Admitting: Obstetrics & Gynecology

## 2011-09-12 ENCOUNTER — Encounter: Payer: Self-pay | Admitting: Obstetrics & Gynecology

## 2011-09-12 ENCOUNTER — Ambulatory Visit (INDEPENDENT_AMBULATORY_CARE_PROVIDER_SITE_OTHER): Payer: PRIVATE HEALTH INSURANCE | Admitting: Obstetrics & Gynecology

## 2011-09-12 VITALS — BP 117/78 | HR 65 | Ht 68.0 in | Wt 185.0 lb

## 2011-09-12 DIAGNOSIS — Z23 Encounter for immunization: Secondary | ICD-10-CM

## 2011-09-12 DIAGNOSIS — L293 Anogenital pruritus, unspecified: Secondary | ICD-10-CM

## 2011-09-12 DIAGNOSIS — N898 Other specified noninflammatory disorders of vagina: Secondary | ICD-10-CM

## 2011-09-12 MED ORDER — CLOBETASOL PROPIONATE 0.05 % EX CREA: TOPICAL_CREAM | CUTANEOUS | Status: DC

## 2011-09-12 NOTE — Progress Notes (Signed)
  Subjective:    Patient ID: Susan Byrd, female    DOB: 1945-12-07, 65 y.o.   MRN: [id number]  HPI  She has been using her Vagifem 2 times per week but has occasional vagina itching.  I biopsied her vulva last year and she has a keratosis bordering on lichen planus.    Review of Systems Her husband of 40 years died this 07/16/2023 from prostate cancer.  She denies depression.    Objective:   Physical Exam  Mild atrophy of vulva noted.  No areas concerning for malignancy.      Assessment & Plan:  Lichen planus causing itch.  I will prescripe temovate 2-3 times per week.  She will be getting her annual exam with her Primary care MD soon.  Her mammogram is due 4/13.

## 2011-09-13 LAB — WET PREP, GENITAL: Clue Cells Wet Prep HPF POC: NONE SEEN

## 2011-09-14 ENCOUNTER — Other Ambulatory Visit: Payer: Self-pay | Admitting: *Deleted

## 2011-09-14 MED ORDER — LEVOTHYROXINE SODIUM 100 MCG PO TABS: 100.0000 ug | ORAL_TABLET | Freq: Every day | ORAL | Status: DC

## 2011-11-12 ENCOUNTER — Other Ambulatory Visit: Payer: Self-pay | Admitting: Family Medicine

## 2011-12-11 DIAGNOSIS — H4010X0 Unspecified open-angle glaucoma, stage unspecified: Secondary | ICD-10-CM | POA: Diagnosis not present

## 2012-02-13 ENCOUNTER — Other Ambulatory Visit: Payer: Self-pay | Admitting: Family Medicine

## 2012-02-13 DIAGNOSIS — E0590 Thyrotoxicosis, unspecified without thyrotoxic crisis or storm: Secondary | ICD-10-CM

## 2012-02-13 DIAGNOSIS — I10 Essential (primary) hypertension: Secondary | ICD-10-CM

## 2012-02-13 DIAGNOSIS — E785 Hyperlipidemia, unspecified: Secondary | ICD-10-CM

## 2012-02-19 ENCOUNTER — Other Ambulatory Visit (INDEPENDENT_AMBULATORY_CARE_PROVIDER_SITE_OTHER): Payer: Medicare Other

## 2012-02-19 DIAGNOSIS — E785 Hyperlipidemia, unspecified: Secondary | ICD-10-CM | POA: Diagnosis not present

## 2012-02-19 DIAGNOSIS — I10 Essential (primary) hypertension: Secondary | ICD-10-CM

## 2012-02-19 DIAGNOSIS — E0590 Thyrotoxicosis, unspecified without thyrotoxic crisis or storm: Secondary | ICD-10-CM | POA: Diagnosis not present

## 2012-02-19 LAB — LIPID PANEL
Cholesterol: 165 mg/dL (ref 0–200)
HDL: 43.6 mg/dL (ref >39.00)
Triglycerides: 190 mg/dL — ABNORMAL HIGH (ref 0.0–149.0)
VLDL: 38 mg/dL (ref 0.0–40.0)

## 2012-02-19 LAB — COMPREHENSIVE METABOLIC PANEL
Alkaline Phosphatase: 51 U/L (ref 39–117)
BUN: 22 mg/dL (ref 6–23)
CO2: 30 mEq/L (ref 19–32)
Creatinine, Ser: 1.1 mg/dL (ref 0.4–1.2)
GFR: 54.54 mL/min — ABNORMAL LOW (ref >60.00)
Glucose, Bld: 114 mg/dL — ABNORMAL HIGH (ref 70–99)
Total Bilirubin: 0.7 mg/dL (ref 0.3–1.2)

## 2012-02-26 ENCOUNTER — Ambulatory Visit (INDEPENDENT_AMBULATORY_CARE_PROVIDER_SITE_OTHER): Payer: Medicare Other | Admitting: Family Medicine

## 2012-02-26 ENCOUNTER — Encounter: Payer: Self-pay | Admitting: Family Medicine

## 2012-02-26 VITALS — BP 110/80 | HR 68 | Ht 68.25 in | Wt 194.0 lb

## 2012-02-26 DIAGNOSIS — E0590 Thyrotoxicosis, unspecified without thyrotoxic crisis or storm: Secondary | ICD-10-CM

## 2012-02-26 DIAGNOSIS — Z1211 Encounter for screening for malignant neoplasm of colon: Secondary | ICD-10-CM

## 2012-02-26 DIAGNOSIS — E785 Hyperlipidemia, unspecified: Secondary | ICD-10-CM | POA: Diagnosis not present

## 2012-02-26 DIAGNOSIS — Z0000 Encounter for general adult medical examination without abnormal findings: Secondary | ICD-10-CM

## 2012-02-26 DIAGNOSIS — Z1231 Encounter for screening mammogram for malignant neoplasm of breast: Secondary | ICD-10-CM

## 2012-02-26 DIAGNOSIS — Z23 Encounter for immunization: Secondary | ICD-10-CM | POA: Diagnosis not present

## 2012-02-26 DIAGNOSIS — Z780 Asymptomatic menopausal state: Secondary | ICD-10-CM | POA: Diagnosis not present

## 2012-02-26 DIAGNOSIS — I10 Essential (primary) hypertension: Secondary | ICD-10-CM

## 2012-02-26 MED ORDER — LEVOTHYROXINE SODIUM 112 MCG PO TABS: 112.0000 ug | ORAL_TABLET | Freq: Every day | ORAL | Status: DC

## 2012-02-26 MED ORDER — LEVOTHYROXINE SODIUM 125 MCG PO TABS: 125.0000 ug | ORAL_TABLET | Freq: Every day | ORAL | Status: DC

## 2012-02-26 MED ORDER — EZETIMIBE-SIMVASTATIN 10-40 MG PO TABS: 1.0000 | ORAL_TABLET | Freq: Every day | ORAL | Status: DC

## 2012-02-26 NOTE — Progress Notes (Signed)
66 yo here for welcome to medicare visit.  Overall doing ok.  Husband died from prostate cancer in 2023-09-14 but she feels she is handing it ok.  Has great support from friends and family.  1.  Hypothyroidism- has been on Synthroid 100 micrograms daily since last year.    Does feel a little more fatigued. Otherwise, denies any symptoms of hypo or hyperthyroidism. Lab Results  Component Value Date   TSH 7.29* 02/19/2012  FT4 0.78  2.  HLD- has been on Vytorin 10-40 mg.   Lab Results  Component Value Date   CHOL 165 02/19/2012   HDL 43.60 02/19/2012   LDLCALC 83 02/19/2012   TRIG 190.0* 02/19/2012   CHOLHDL 4 02/19/2012     3.  HTN- has been on Spironolactone/HCTZ 25/25 for years.  No CP, SOB, LE edema or blurred vision.  Lab Results  Component Value Date   CREATININE 1.1 02/19/2012   4.  Elevated CBG- was fasting for labs.  Glucose 114.  Denies any increased thirst or urination.  Well woman- due for mammogram in 03/2012.   Lichen planus- using temovate 2-3 times per week.  Has been helping significantly.  Patient Active Problem List  Diagnoses  . HYPERTHYROIDISM  . HYPERLIPIDEMIA  . MIGRAINE HEADACHE  . HYPERTENSION  . ACUTE FRONTAL SINUSITIS  . LUMP OR MASS IN BREAST  . VAGINITIS, ATROPHIC  . COUGH  . RIB PAIN  . Routine general medical examination at a health care facility   Past Medical History  Diagnosis Date  . Hyperlipidemia   . Hypertension   . Thyroid disease   . Migraine   . High cholesterol    Past Surgical History  Procedure Date  . Cholecystectomy   . Abdominal hysterectomy   . Bunionectomy    History  Substance Use Topics  . Smoking status: Never Smoker   . Smokeless tobacco: Not on file  . Alcohol Use: Yes     1-2 PER WEEK   Family History  Problem Relation Age of Onset  . Cancer Mother     bone  . Stroke Father   . Heart disease Brother   . Hypertension Brother   . Heart disease Sister   . Hypertension Sister    Allergies    Allergen Reactions  . Amoxicillin     REACTION: rash  . Penicillins     REACTION: rash  . Sulfonamide Derivatives     REACTION: rash   Current Outpatient Prescriptions on File Prior to Visit  Medication Sig Dispense Refill  . Estradiol (VAGIFEM) 10 MCG TABS One tablet twice a week  24 tablet  3  . latanoprost (XALATAN) 0.005 % ophthalmic solution Place 1 drop into both eyes daily.        . SUMAtriptan (IMITREX) 100 MG tablet One tablet by mouth at the onset of migraine.  May repeat dose in one hour if headaches not resolved       . clobetasol (TEMOVATE) 0.05 % cream Apply to vulva 2-3 times per week  30 g  6  . ergotamine-caffeine (CAFERGOT) 1-100 MG per tablet Take 2 tablets by mouth once. Two tablets at onset of attack; then 1 tablet every 30 minutes as needed; maximum: 6 tablets per attack; do not exceed 10 tablets/week       . spironolactone-hydrochlorothiazide (ALDACTAZIDE) 25-25 MG per tablet TAKE 2 TABLETS BY MOUTH DAILY  180 tablet  3       The PMH, PSH, Social History, Family  History, Medications, and allergies have been reviewed in [facility], and have been updated if relevant.   Review of Systems       See HPI Patient reports no  vision/ hearing changes,anorexia, weight change, fever ,adenopathy, persistant / recurrent hoarseness, swallowing issues, chest pain, edema,persistant / recurrent cough, hemoptysis, dyspnea(rest, exertional, paroxysmal nocturnal), gastrointestinal  bleeding (melena, rectal bleeding), abdominal pain, excessive heart burn, GU symptoms(dysuria, hematuria, pyuria, voiding/incontinence  Issues) syncope, focal weakness, severe memory loss, concerning skin lesions, depression, anxiety, abnormal bruising/bleeding, major joint swelling, breast masses or abnormal vaginal bleeding.     Physical Exam BP 110/80  Pulse 68  Ht 5' 8.25" (1.734 m)  Wt 194 lb (87.998 kg)  BMI 29.28 kg/m2 Wt Readings from Last 3 Encounters:  02/26/12 194 lb (87.998 kg)  09/12/11 185  lb (83.915 kg)  03/13/11 186 lb 12.8 oz (84.732 kg)    General:  Well-developed,well-nourished,in no acute distress; alert,appropriate and cooperative throughout examination Head:  Normocephalic and atraumatic without obvious abnormalities. No apparent alopecia or balding. Eyes:  vision grossly intact, pupils equal, and pupils round.   Ears:  R ear normal and L ear normal.   Nose:  no external deformity.   Mouth:  good dentition and no gingival abnormalities.   Neck:  No deformities, masses, or tenderness noted. Lungs:  normal respiratory effort, no intercostal retractions, no accessory muscle use, no crackles, and no wheezes.   Heart:  normal rate, regular rhythm, and no murmur.   Abdomen:  Bowel sounds positive,abdomen soft and non-tender without masses, organomegaly or hernias noted. Msk:  No deformity or scoliosis noted of thoracic or lumbar spine.   Extremities:  No clubbing, cyanosis, edema, or deformity noted with normal full range of motion of all joints.   Neurologic:  alert & oriented X3.   Skin:  Intact without suspicious lesions or rashes Psych:  Cognition and judgment appear intact. Alert and cooperative with normal attention span and concentration. No apparent delusions, illusions, hallucinations  Assessment and Plan:  1. HYPERTHYROIDISM  Deteriorated. Since she is symptomatic (elevated TSH with normal FT4), will increase dose of synthroid back to 112 mcg.   2. Routine general medical examination at a health care facility  The patients weight, height, BMI and visual acuity have been recorded in the chart I have made referrals, counseling and provided education to the patient based review of the above and I have provided the pt with a written personalized care plan for preventive services.   EKG 12-Lead Fecal occult blood, imunochemical DG Bone Density MM Digital Screening   3. HYPERTENSION  Stable on current meds.   4. Hyperglycemia Discussed eat right diet. Will  recheck fasting CBG in 6 months.

## 2012-02-26 NOTE — Progress Notes (Signed)
Addended by: Eliezer Bottom on: 02/26/2012 10:01 AM   Modules accepted: Orders

## 2012-02-26 NOTE — Patient Instructions (Signed)
Great to see you, Susan Byrd. I am so sorry about the loss of your husband. Please let me know if I can help in any way. We have increased your synthroid to 112 mcg daily.  Let's plan on rechecking your thyroid labs in 8-12 weeks. Please stop by to see Shirlee Limerick on your way out to set up your mammogram and bone density scan after 4/23/20213.

## 2012-03-28 ENCOUNTER — Ambulatory Visit
Admission: RE | Admit: 2012-03-28 | Discharge: 2012-03-28 | Disposition: A | Discharge status: Home / Self Care | Payer: PRIVATE HEALTH INSURANCE | Source: Ambulatory Visit | Attending: Family Medicine | Admitting: Family Medicine

## 2012-03-28 DIAGNOSIS — Z1231 Encounter for screening mammogram for malignant neoplasm of breast: Secondary | ICD-10-CM

## 2012-03-28 DIAGNOSIS — Z780 Asymptomatic menopausal state: Secondary | ICD-10-CM

## 2012-03-28 DIAGNOSIS — M899 Disorder of bone, unspecified: Secondary | ICD-10-CM | POA: Diagnosis not present

## 2012-04-01 ENCOUNTER — Other Ambulatory Visit: Payer: Medicare Other

## 2012-04-01 DIAGNOSIS — Z1211 Encounter for screening for malignant neoplasm of colon: Secondary | ICD-10-CM

## 2012-04-01 LAB — FECAL OCCULT BLOOD, IMMUNOCHEMICAL: Fecal Occult Bld: NEGATIVE

## 2012-04-03 ENCOUNTER — Encounter: Payer: Self-pay | Admitting: *Deleted

## 2012-04-03 ENCOUNTER — Encounter: Payer: Self-pay | Admitting: Family Medicine

## 2012-04-10 ENCOUNTER — Other Ambulatory Visit: Payer: Self-pay | Admitting: Family Medicine

## 2012-06-10 DIAGNOSIS — H4010X0 Unspecified open-angle glaucoma, stage unspecified: Secondary | ICD-10-CM | POA: Diagnosis not present

## 2012-06-12 ENCOUNTER — Other Ambulatory Visit: Payer: Self-pay | Admitting: Family Medicine

## 2012-07-08 ENCOUNTER — Ambulatory Visit (INDEPENDENT_AMBULATORY_CARE_PROVIDER_SITE_OTHER): Payer: Medicare Other | Admitting: Family Medicine

## 2012-07-08 ENCOUNTER — Encounter: Payer: Self-pay | Admitting: Family Medicine

## 2012-07-08 VITALS — BP 110/78 | HR 76 | Temp 97.8°F | Wt 194.0 lb

## 2012-07-08 DIAGNOSIS — F4321 Adjustment disorder with depressed mood: Secondary | ICD-10-CM | POA: Diagnosis not present

## 2012-07-08 DIAGNOSIS — R079 Chest pain, unspecified: Secondary | ICD-10-CM | POA: Diagnosis not present

## 2012-07-08 DIAGNOSIS — F4320 Adjustment disorder, unspecified: Secondary | ICD-10-CM

## 2012-07-08 NOTE — Progress Notes (Signed)
66 yo here for ? GERD.  Past several weeks to months, increased epigastric burning, relieved by Zantac on most occassions. She has had some pain radiating to left chest and arm when this occurs.  Her sister does have a h/o CAD.  Susan Byrd is also coming up on the 1 year anniversary of her husband's death, so she feels anxiety is playing a roll as well.  No SI or HI.  No CP with exertion. No SOB. No diaphoresis or palpitations.  Patient Active Problem List  Diagnosis  . HYPERTHYROIDISM  . HYPERLIPIDEMIA  . MIGRAINE HEADACHE  . HYPERTENSION  . ACUTE FRONTAL SINUSITIS  . LUMP OR MASS IN BREAST  . VAGINITIS, ATROPHIC  . COUGH  . RIB PAIN  . Routine general medical examination at a health care facility  . Chest pain  . Grief reaction   Past Medical History  Diagnosis Date  . Hyperlipidemia   . Hypertension   . Thyroid disease   . Migraine   . High cholesterol    Past Surgical History  Procedure Date  . Cholecystectomy   . Abdominal hysterectomy   . Bunionectomy    History  Substance Use Topics  . Smoking status: Never Smoker   . Smokeless tobacco: Not on file  . Alcohol Use: Yes     1-2 PER WEEK   Family History  Problem Relation Age of Onset  . Cancer Mother     bone  . Stroke Father   . Heart disease Brother   . Hypertension Brother   . Heart disease Sister   . Hypertension Sister    Allergies  Allergen Reactions  . Amoxicillin     REACTION: rash  . Penicillins     REACTION: rash  . Sulfonamide Derivatives     REACTION: rash   Current Outpatient Prescriptions on File Prior to Visit  Medication Sig Dispense Refill  . clobetasol (TEMOVATE) 0.05 % cream Apply to vulva 2-3 times per week  30 g  6  . Estradiol (VAGIFEM) 10 MCG TABS One tablet twice a week  24 tablet  3  . ezetimibe-simvastatin (VYTORIN) 10-40 MG per tablet Take 1 tablet by mouth daily.  90 tablet  3  . latanoprost (XALATAN) 0.005 % ophthalmic solution Place 1 drop into both eyes  daily.        Marland Kitchen levothyroxine (SYNTHROID, LEVOTHROID) 112 MCG tablet TAKE 1 TABLET (112 MCG TOTAL) BY MOUTH DAILY.  30 tablet  8  . spironolactone-hydrochlorothiazide (ALDACTAZIDE) 25-25 MG per tablet TAKE 2 TABLETS BY MOUTH DAILY  180 tablet  3  . SUMAtriptan (IMITREX) 100 MG tablet One tablet by mouth at the onset of migraine.  May repeat dose in one hour if headaches not resolved          The PMH, PSH, Social History, Family History, Medications, and allergies have been reviewed in [facility], and have been updated if relevant.   Review of Systems       See HPI  Physical Exam BP 110/78  Pulse 76  Temp 97.8 F (36.6 C)  Wt 194 lb (87.998 kg)  General:  Well-developed,well-nourished,in no acute distress; alert,appropriate and cooperative throughout examination Head:  Normocephalic and atraumatic without obvious abnormalities. No apparent alopecia or balding. Eyes:  vision grossly intact, pupils equal, and pupils round.   Ears:  R ear normal and L ear normal.   Nose:  no external deformity.   Mouth:  good dentition and no gingival abnormalities.   Neck:  No deformities, masses, or tenderness noted. Lungs:  normal respiratory effort, no intercostal retractions, no accessory muscle use, no crackles, and no wheezes.   Heart:  normal rate, regular rhythm, and no murmur.   Abdomen:  Bowel sounds positive,abdomen soft and non-tender without masses, organomegaly or hernias noted. Msk:  No deformity or scoliosis noted of thoracic or lumbar spine.   Extremities:  No clubbing, cyanosis, edema, or deformity noted with normal full range of motion of all joints.   Neurologic:  alert & oriented X3.   Skin:  Intact without suspicious lesions or rashes Psych:  Cognition and judgment appear intact. Alert and cooperative with normal attention span and concentration. No apparent delusions, illusions, hallucinations  Assessment and Plan: 1. Chest pain  New- EKG reassuring- NSR. Likely GERD worsened by  acute stressors. Will try prilosec 20 mg daily x  2 weeks (samples given). See pt instructions for details. EKG 12-Lead  2. Grief reaction  Due to anniversary of husband's death. Denies SI or HI.  Feels she is coping ok. She will call me if symptoms worsen.  Has supportive friends and family.      A

## 2012-07-08 NOTE — Patient Instructions (Signed)
Let's try some prilosec 20 mg daily for next two weeks.   Exclude gas producing foods (beans, onions, celery, carrots, raisins, bananas, apricots, prunes, brussel sprouts, wheat germ, pretzels)   Please call me in 2 weeks with an update.  It was so nice to see you, Ms. Sallis.  Hang in there.

## 2012-10-24 DIAGNOSIS — D239 Other benign neoplasm of skin, unspecified: Secondary | ICD-10-CM | POA: Diagnosis not present

## 2012-10-24 DIAGNOSIS — D1800 Hemangioma unspecified site: Secondary | ICD-10-CM | POA: Diagnosis not present

## 2012-10-24 DIAGNOSIS — L738 Other specified follicular disorders: Secondary | ICD-10-CM | POA: Diagnosis not present

## 2012-10-27 ENCOUNTER — Telehealth: Payer: Self-pay

## 2012-10-27 NOTE — Telephone Encounter (Signed)
pt left v/m; pt scheduled for flu shot 10/28/12 at 10am and request lab test for thyroid levels to be done at same time.Please advise. Pt request call back.

## 2012-10-27 NOTE — Telephone Encounter (Signed)
Ok to check TSH, FT4.

## 2012-10-28 ENCOUNTER — Other Ambulatory Visit: Payer: Self-pay | Admitting: *Deleted

## 2012-10-28 ENCOUNTER — Other Ambulatory Visit: Payer: Medicare Other

## 2012-10-28 ENCOUNTER — Ambulatory Visit: Payer: Medicare Other

## 2012-10-28 MED ORDER — SPIRONOLACTONE-HCTZ 25-25 MG PO TABS: ORAL_TABLET | ORAL | Status: DC

## 2012-11-03 ENCOUNTER — Other Ambulatory Visit: Payer: Medicare Other

## 2012-11-03 ENCOUNTER — Telehealth: Payer: Self-pay

## 2012-11-03 NOTE — Telephone Encounter (Signed)
Left message asking patient to call back

## 2012-11-03 NOTE — Telephone Encounter (Signed)
Pt has appt 11/04/12 Tues for lab work and flu shot. Pt has up to date tetanus but is traveling to [place] in January and pt said 18 recommendations of meds or vaccinations needed. Pt only wants to take what is necessary. Pt does not want to check with health dept wants Dr Elmer Sow recommendation.Please advise.

## 2012-11-03 NOTE — Telephone Encounter (Signed)
I would recommend looking at [email or web address]- look up traveling to [place].  I did and looks like she will likely need Hep A and Typhoid.  The other immunizations are based on what she is planning on doing during her trip so I advise that she review this site.  It is the most accurate.

## 2012-11-04 ENCOUNTER — Other Ambulatory Visit (INDEPENDENT_AMBULATORY_CARE_PROVIDER_SITE_OTHER): Payer: Medicare Other

## 2012-11-04 ENCOUNTER — Ambulatory Visit (INDEPENDENT_AMBULATORY_CARE_PROVIDER_SITE_OTHER): Payer: Medicare Other

## 2012-11-04 ENCOUNTER — Telehealth: Payer: Self-pay | Admitting: Family Medicine

## 2012-11-04 DIAGNOSIS — E0590 Thyrotoxicosis, unspecified without thyrotoxic crisis or storm: Secondary | ICD-10-CM | POA: Diagnosis not present

## 2012-11-04 DIAGNOSIS — Z23 Encounter for immunization: Secondary | ICD-10-CM | POA: Diagnosis not present

## 2012-11-04 NOTE — Telephone Encounter (Signed)
Please see 11/03/12 phone note.

## 2012-11-04 NOTE — Telephone Encounter (Signed)
Patient Information:  Caller Name: Vina  Phone: [phone]  Patient: Susan Byrd, Susan Byrd  Gender: Female  DOB: 02/18/1946  Age: 66 Years  PCP: Ruthe Mannan (Family Practice)   Symptoms  Reason For Call & Symptoms: travelling to [place] in January 2014 has vaccination and insurance questions.  Reviewed Health History In EMR: No  Reviewed Medications In EMR: No  Reviewed Allergies In EMR: No  Reviewed Surgeries / Procedures: No  Date of Onset of Symptoms: 11/04/2012  Guideline(s) Used:  Adult Immunization Schedule, by Vaccine and Age Group - [place] States  Office Hours Triage Dispositions  No Protocol Available - Information Only  Disposition Per Guideline:   Home Care  Reason For Disposition Reached:   Information only question and nurse able to answer  Advice Given:  N/A  Office Follow Up:  Does the office need to follow up with this patient?: Yes  Instructions For The Office: 12/2012 going to [place] and would like prescriptions necessary (i.e. antibiotics Melaria) and insurance questions regarding vaccinations necessary.  RN Note:  Has appointment today @ 14:00 in office and will get flu vaccine, instructed to tell office about trip for prescription needs and call [employer] office for Travel Vaccination needs and insurance questions.

## 2012-11-06 NOTE — Telephone Encounter (Signed)
Spoke with patient, she is getting direction from [employer] on vaccines.

## 2012-11-06 NOTE — Telephone Encounter (Signed)
Spoke with patient, she is following up the [employer].

## 2012-11-17 ENCOUNTER — Telehealth: Payer: Self-pay

## 2012-11-17 DIAGNOSIS — Z23 Encounter for immunization: Secondary | ICD-10-CM | POA: Diagnosis not present

## 2012-11-17 NOTE — Telephone Encounter (Signed)
Dee, can you please call pt back to answer these questions.  Thank you.

## 2012-11-17 NOTE — Telephone Encounter (Signed)
Pt left note traveling to [place] 12/07/11. Pt just had shots at public health dept office. Public health suggested pt have Hep A booster and hep B shots. Pt wants to know if can be done at Dr Aron's office. Pt also wanted to know if the 02/26/12 Td is a Tdap and if not why not? Please advise. Pt request call back.

## 2012-11-18 NOTE — Telephone Encounter (Signed)
Spoke with patient and she will come in for the twinrix (hep A & B) nurse visit scheduled

## 2012-11-19 ENCOUNTER — Ambulatory Visit (INDEPENDENT_AMBULATORY_CARE_PROVIDER_SITE_OTHER): Payer: Medicare Other | Admitting: Family Medicine

## 2012-11-19 DIAGNOSIS — Z23 Encounter for immunization: Secondary | ICD-10-CM

## 2012-11-28 ENCOUNTER — Ambulatory Visit (INDEPENDENT_AMBULATORY_CARE_PROVIDER_SITE_OTHER): Payer: Medicare Other | Admitting: *Deleted

## 2012-11-28 DIAGNOSIS — Z23 Encounter for immunization: Secondary | ICD-10-CM

## 2012-12-24 ENCOUNTER — Ambulatory Visit (INDEPENDENT_AMBULATORY_CARE_PROVIDER_SITE_OTHER): Payer: Medicare Other | Admitting: *Deleted

## 2012-12-24 DIAGNOSIS — Z23 Encounter for immunization: Secondary | ICD-10-CM

## 2012-12-25 DIAGNOSIS — H4010X0 Unspecified open-angle glaucoma, stage unspecified: Secondary | ICD-10-CM | POA: Diagnosis not present

## 2012-12-30 NOTE — Progress Notes (Signed)
This encounter was used for an injection visit only. 

## 2013-02-23 ENCOUNTER — Ambulatory Visit (INDEPENDENT_AMBULATORY_CARE_PROVIDER_SITE_OTHER): Payer: Medicare Other | Admitting: Obstetrics & Gynecology

## 2013-02-23 ENCOUNTER — Encounter: Payer: Self-pay | Admitting: Obstetrics & Gynecology

## 2013-02-23 VITALS — BP 117/91 | HR 78 | Resp 16 | Ht 68.0 in | Wt 194.0 lb

## 2013-02-23 DIAGNOSIS — Z01419 Encounter for gynecological examination (general) (routine) without abnormal findings: Secondary | ICD-10-CM | POA: Diagnosis not present

## 2013-02-23 DIAGNOSIS — Z0000 Encounter for general adult medical examination without abnormal findings: Secondary | ICD-10-CM

## 2013-02-23 DIAGNOSIS — N952 Postmenopausal atrophic vaginitis: Secondary | ICD-10-CM | POA: Diagnosis not present

## 2013-02-23 MED ORDER — ESTRADIOL 10 MCG VA TABS: ORAL_TABLET | VAGINAL | Status: DC

## 2013-02-23 NOTE — Progress Notes (Signed)
Subjective:    Susan Byrd is a 67 y.o. female who presents for an annual exam. The patient has no complaints today except some vaginal itching and would like to increase her vaginal estrogen frequency.The patient is not currently sexually active. GYN screening history: last pap: was normal. The patient wears seatbelts: yes. The patient participates in regular exercise: no. Has the patient ever been transfused or tattooed?: no. The patient reports that there is not domestic violence in her life.   Menstrual History: OB History   Grav Para Term Preterm Abortions TAB SAB Ect Mult Living   1 0              Menarche age: 36  No LMP recorded. Patient has had a hysterectomy.    The following portions of the patient's history were reviewed and updated as appropriate: allergies, current medications, past family history, past medical history, past social history, past surgical history and problem list.  Review of Systems A comprehensive review of systems was negative. Her husband of 40 years died 2012/05/04. She is not dating currently. 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 She had a DEXA 05/04/2012 (osteopenia) but she will discuss her treatment option at her next visit   Objective:    BP 117/91  Pulse 78  Resp 16  Ht 5\' 8"  (1.727 m)  Wt 194 lb (87.998 kg)  BMI 29.5 kg/m2  General Appearance:    Alert, cooperative, no distress, appears stated age  Head:    Normocephalic, without obvious abnormality, atraumatic  Eyes:    PERRL, conjunctiva/corneas clear, EOM's intact, fundi    benign, both eyes  Ears:    Normal TM's and external ear canals, both ears  Nose:   Nares normal, septum midline, mucosa normal, no drainage    or sinus tenderness  Throat:   Lips, mucosa, and tongue normal; teeth and gums normal  Neck:   Supple, symmetrical, trachea midline, no adenopathy;    thyroid:  no enlargement/tenderness/nodules; no carotid   bruit  or JVD  Back:     Symmetric, no curvature, ROM normal, no CVA tenderness  Lungs:     Clear to auscultation bilaterally, respirations unlabored  Chest Wall:    No tenderness or deformity   Heart:    Regular rate and rhythm, S1 and S2 normal, no murmur, rub   or gallop  Breast Exam:    No tenderness, masses, or nipple abnormality  Abdomen:     Soft, non-tender, bowel sounds active all four quadrants,    no masses, no organomegaly  Genitalia:    Normal female without lesion, discharge or tenderness     Extremities:   Extremities normal, atraumatic, no cyanosis or edema  Pulses:   2+ and symmetric all extremities  Skin:   Skin color, texture, turgor normal, no rashes or lesions  Lymph nodes:   Cervical, supraclavicular, and axillary nodes normal  Neurologic:   CNII-XII intact, normal strength, sensation and reflexes    throughout  .    Assessment:    Healthy female exam.    Plan:     Mammogram.

## 2013-03-16 ENCOUNTER — Other Ambulatory Visit: Payer: Self-pay | Admitting: *Deleted

## 2013-03-16 MED ORDER — EZETIMIBE-SIMVASTATIN 10-40 MG PO TABS: 1.0000 | ORAL_TABLET | Freq: Every day | ORAL | Status: DC

## 2013-04-10 ENCOUNTER — Ambulatory Visit
Admission: RE | Admit: 2013-04-10 | Discharge: 2013-04-10 | Disposition: A | Discharge status: Home / Self Care | Payer: Medicare Other | Source: Ambulatory Visit | Attending: Obstetrics & Gynecology | Admitting: Obstetrics & Gynecology

## 2013-04-10 DIAGNOSIS — Z1231 Encounter for screening mammogram for malignant neoplasm of breast: Secondary | ICD-10-CM | POA: Diagnosis not present

## 2013-04-10 DIAGNOSIS — Z0000 Encounter for general adult medical examination without abnormal findings: Secondary | ICD-10-CM

## 2013-04-24 ENCOUNTER — Ambulatory Visit (INDEPENDENT_AMBULATORY_CARE_PROVIDER_SITE_OTHER): Payer: Medicare Other | Admitting: Obstetrics & Gynecology

## 2013-04-24 ENCOUNTER — Encounter: Payer: Self-pay | Admitting: Obstetrics & Gynecology

## 2013-04-24 VITALS — BP 108/69 | HR 66 | Ht 68.0 in | Wt 193.0 lb

## 2013-04-24 DIAGNOSIS — I10 Essential (primary) hypertension: Secondary | ICD-10-CM

## 2013-04-24 DIAGNOSIS — E785 Hyperlipidemia, unspecified: Secondary | ICD-10-CM

## 2013-04-24 DIAGNOSIS — Z01419 Encounter for gynecological examination (general) (routine) without abnormal findings: Secondary | ICD-10-CM

## 2013-04-24 DIAGNOSIS — Z0000 Encounter for general adult medical examination without abnormal findings: Secondary | ICD-10-CM

## 2013-04-24 DIAGNOSIS — Z0189 Encounter for other specified special examinations: Secondary | ICD-10-CM

## 2013-04-24 DIAGNOSIS — Z139 Encounter for screening, unspecified: Secondary | ICD-10-CM | POA: Diagnosis not present

## 2013-04-24 LAB — LIPID PANEL
HDL: 43 mg/dL (ref >39)
LDL Cholesterol: 71 mg/dL (ref 0–99)
Triglycerides: 176 mg/dL — ABNORMAL HIGH (ref <150)
VLDL: 35 mg/dL (ref 0–40)

## 2013-04-24 LAB — CBC WITH DIFFERENTIAL/PLATELET
Basophils Absolute: 0 10*3/uL (ref 0.0–0.1)
Basophils Relative: 0 % (ref 0–1)
MCHC: 33.7 g/dL (ref 30.0–36.0)
Neutro Abs: 3.7 10*3/uL (ref 1.7–7.7)
Neutrophils Relative %: 63 % (ref 43–77)
RDW: 14 % (ref 11.5–15.5)

## 2013-04-24 NOTE — Progress Notes (Signed)
  Subjective:    Patient ID: Susan Byrd, female    DOB: 1946/09/28, 67 y.o.   MRN: [id number]  HPI  She is here for fasting labs. I decreased her lipid and htn meds by half  Review of Systems     Objective:   Physical Exam        Assessment & Plan:  As above

## 2013-04-25 LAB — COMPLETE METABOLIC PANEL WITH GFR
Alkaline Phosphatase: 55 U/L (ref 39–117)
GFR, Est Non African American: 57 mL/min — ABNORMAL LOW
Glucose, Bld: 101 mg/dL — ABNORMAL HIGH (ref 70–99)
Sodium: 139 mEq/L (ref 135–145)
Total Bilirubin: 0.6 mg/dL (ref 0.3–1.2)
Total Protein: 6.8 g/dL (ref 6.0–8.3)

## 2013-04-25 LAB — TSH: TSH: 0.752 u[IU]/mL (ref 0.350–4.500)

## 2013-04-27 ENCOUNTER — Other Ambulatory Visit: Payer: Self-pay | Admitting: Family Medicine

## 2013-06-25 DIAGNOSIS — H532 Diplopia: Secondary | ICD-10-CM | POA: Diagnosis not present

## 2013-07-29 DIAGNOSIS — S92919A Unspecified fracture of unspecified toe(s), initial encounter for closed fracture: Secondary | ICD-10-CM | POA: Diagnosis not present

## 2013-07-29 DIAGNOSIS — G5760 Lesion of plantar nerve, unspecified lower limb: Secondary | ICD-10-CM | POA: Diagnosis not present

## 2013-07-29 DIAGNOSIS — G588 Other specified mononeuropathies: Secondary | ICD-10-CM

## 2013-07-29 HISTORY — DX: Other specified mononeuropathies: G58.8

## 2013-08-22 ENCOUNTER — Encounter: Payer: Self-pay | Admitting: *Deleted

## 2013-08-22 DIAGNOSIS — G588 Other specified mononeuropathies: Secondary | ICD-10-CM | POA: Insufficient documentation

## 2013-09-02 ENCOUNTER — Ambulatory Visit: Payer: Self-pay | Admitting: Podiatry

## 2013-10-02 ENCOUNTER — Telehealth: Payer: Self-pay

## 2013-10-02 NOTE — Telephone Encounter (Signed)
Pt left v/m requesting information about labs done 04/2013 at Dr Norman Clay office. Left v/m advising pt she would need to contact Dr Ellin Saba office for those lab results.

## 2013-10-07 ENCOUNTER — Other Ambulatory Visit: Payer: Self-pay | Admitting: *Deleted

## 2013-10-07 MED ORDER — SPIRONOLACTONE-HCTZ 25-25 MG PO TABS: ORAL_TABLET | ORAL | Status: DC

## 2013-10-07 MED ORDER — EZETIMIBE-SIMVASTATIN 10-40 MG PO TABS: 1.0000 | ORAL_TABLET | Freq: Every day | ORAL | Status: DC

## 2013-10-08 DIAGNOSIS — H5000 Unspecified esotropia: Secondary | ICD-10-CM | POA: Diagnosis not present

## 2013-11-04 ENCOUNTER — Other Ambulatory Visit: Payer: Self-pay | Admitting: Family Medicine

## 2013-11-19 ENCOUNTER — Ambulatory Visit (INDEPENDENT_AMBULATORY_CARE_PROVIDER_SITE_OTHER): Payer: Medicare Other

## 2013-11-19 DIAGNOSIS — Z23 Encounter for immunization: Secondary | ICD-10-CM

## 2013-11-28 ENCOUNTER — Other Ambulatory Visit: Payer: Self-pay | Admitting: Family Medicine

## 2013-12-24 DIAGNOSIS — H5000 Unspecified esotropia: Secondary | ICD-10-CM | POA: Diagnosis not present

## 2013-12-28 ENCOUNTER — Telehealth: Payer: Self-pay | Admitting: Family Medicine

## 2013-12-28 NOTE — Telephone Encounter (Signed)
No Tamiflu would unlikely be beneficial at this point.

## 2013-12-28 NOTE — Telephone Encounter (Signed)
Patient Information:  Caller Name: Susan Byrd  Phone: [phone]  Patient: Susan Byrd, Susan Byrd  Gender: Female  DOB: Nov 23, 1946  Age: 68 Years  PCP: Arnette Norris [facility])  Office Follow Up:  Does the office need to follow up with this patient?: Yes  Instructions For The Office: Plese discuss with MD if Tamiflu would be beneficial at this point and call back.  RN Note:  Ribs are sore when coughing. Hydrate and humidify.  RN Alger Memos if Tamiflu woulld be beneficial at this point (day 5). Kristopher Oppenheim on [address] in [place]. Please discuss with MD and call back  Symptoms  Reason For Call & Symptoms: Suspected flu symptoms with aching, fever > 102,  cough, cold symptoms and headache.  Called to ask how to manage residual cough.  Fever ended 12/27/13.  Reviewed Health History In EMR: Yes  Reviewed Medications In EMR: Yes  Reviewed Allergies In EMR: Yes  Reviewed Surgeries / Procedures: Yes  Date of Onset of Symptoms: 12/24/2013  Treatments Tried: expired Rx for Benzonatate, Mucinex  Treatments Tried Worked: Yes  Guideline(s) Used:  Influenza - Seasonal  Disposition Per Guideline:   Discuss with PCP and Callback by Nurse within 1 Hour  Reason For Disposition Reached:   HIGH RISK (e.g., age > 44 years, pregnant, HIV+, chronic medical condition) and flu symptoms  Advice Given:  Reassurance  For most healthy adults, influenza feels like a bad cold. The dangers of influenza for normal, healthy people (under 9 years of age) are overrated.  The treatment of influenza depends on your main symptoms. Generally, treatment is the same as for other viral respiratory infections (colds). Bed rest is unnecessary.  Treating the Symptoms of Flu  Sore Throat: Use throat lozenges, hard candy or warm chicken broth.  Cough: Use cough drops.  Hydrate: Drink extra liquids. If the air in your home is dry, use a humidifier.  No Aspirin  : Do not use aspirin for treatment of fever or pain  (Reason: there is an association between influenza and Reye syndrome).  Isolation is Needed Until After the Fever is Gone:   The CDC recommends that people with influenza-like illness remain at home until at least 24 hours after they are free of fever (100 F or 37.8C).  Do NOT go to work or school.  Do NOT go to church, child care centers, shopping, or other public places.  Do NOT shake hands.  Avoid close contact with others (hugging, kissing).  Call Back If:  Fever lasts more than 3 days  Runny nose lasts more than 10 days  Cough lasts more than 3 weeks  You become short of breath or worse.  For a Stuffy Nose - Use Nasal Washes:  Introduction: Saline (salt water) nasal irrigation (nasal wash) is an effective and simple home remedy for treating stuffy nose and sinus congestion. The nose can be irrigated by pouring, spraying, or squirting salt water into the nose and then letting it run back out.  For a Runny Nose With Profuse Discharge:   Blowing the nose is all that is needed.  Patient Will Follow Care Advice:  YES

## 2013-12-28 NOTE — Telephone Encounter (Signed)
Patient notified as instructed by telephone. Pt will cb if does not improve.

## 2013-12-30 ENCOUNTER — Encounter: Payer: Self-pay | Admitting: Podiatry

## 2013-12-30 ENCOUNTER — Ambulatory Visit (INDEPENDENT_AMBULATORY_CARE_PROVIDER_SITE_OTHER): Payer: Medicare Other

## 2013-12-30 ENCOUNTER — Ambulatory Visit (INDEPENDENT_AMBULATORY_CARE_PROVIDER_SITE_OTHER): Payer: Medicare Other | Admitting: Podiatry

## 2013-12-30 VITALS — BP 115/74 | HR 65 | Resp 16

## 2013-12-30 DIAGNOSIS — M79609 Pain in unspecified limb: Secondary | ICD-10-CM

## 2013-12-30 DIAGNOSIS — M79672 Pain in left foot: Secondary | ICD-10-CM

## 2013-12-30 DIAGNOSIS — G588 Other specified mononeuropathies: Secondary | ICD-10-CM

## 2013-12-30 DIAGNOSIS — G5760 Lesion of plantar nerve, unspecified lower limb: Secondary | ICD-10-CM | POA: Diagnosis not present

## 2013-12-30 NOTE — Telephone Encounter (Signed)
Pt left voicemail checking the status of prev phone call

## 2013-12-30 NOTE — Telephone Encounter (Signed)
Spoke to pt and advised per Webb Silversmith, NP. Pt scheduled for acute ov 12/31/13

## 2013-12-30 NOTE — Progress Notes (Signed)
After christmas i got a pain in the arch and its been numb from under my toes to the lateral side of foot. I had a problem with a neuroma before but after the injection he got much better.  Objective: Vital signs are stable she is alert and oriented x3. I reviewed her past medical history medications and allergies once again. Pulses are palpable left foot. She has pain on palpation to the third interdigital space of the left foot. No pain on medial lateral compression of the calcaneus minimal pain on palpation of the medial continued tubercle left. Cutaneous evaluation demonstrates supple well hydrated cutis. No change in radiographic evaluation.  Assessment: Neuroma possibly associated with lateral compensatory syndrome third interdigital space of the left foot.  Plan: Discussed etiology pathology conservative versus surgical therapies at this point injected third interdigital space with her second dose of cortisone in as many as 5 months. I will followup with her in one month we did discuss the possible need for alcohol injection to

## 2013-12-30 NOTE — Telephone Encounter (Signed)
Pt called back to let us know her sxs are not improved, pt's chest is "burning when she cough", pt is requesting a call back today, routed to NP since Dr. Deborra Medina out of office today

## 2013-12-30 NOTE — Telephone Encounter (Signed)
She needs an OV 

## 2013-12-31 ENCOUNTER — Encounter: Payer: Self-pay | Admitting: Internal Medicine

## 2013-12-31 ENCOUNTER — Ambulatory Visit (INDEPENDENT_AMBULATORY_CARE_PROVIDER_SITE_OTHER): Payer: Medicare Other | Admitting: Internal Medicine

## 2013-12-31 VITALS — BP 118/72 | HR 76 | Temp 97.6°F | Wt 191.0 lb

## 2013-12-31 DIAGNOSIS — J069 Acute upper respiratory infection, unspecified: Secondary | ICD-10-CM | POA: Diagnosis not present

## 2013-12-31 NOTE — Progress Notes (Signed)
HPI  Pt presents to the clinic today with c/o cough, chest congestion, wheezing and headache. She reports this started 1 week ago. She has a burning sensation in her chest when she coughs. The cough is unproductive. She tends to wheeze more when she lays down but denies shortness of breath. She was running low grade fevers earlier in the week.She has tried Mucinex and Ibuprofen. She has no history of allergies or breathing problems. She has not had sick contacts. She did get her flu shot.  Review of Systems      Past Medical History  Diagnosis Date  . Hyperlipidemia   . Hypertension   . Thyroid disease   . Migraine   . High cholesterol   . Interdigital neuroma 07/29/2013    3RD IDS LEFT FOOT    Family History  Problem Relation Age of Onset  . Cancer Mother     bone  . Stroke Father   . Heart disease Brother   . Hypertension Brother   . Heart disease Sister   . Hypertension Sister     History   Social History  . Marital Status: Married    Spouse Name: N/A    Number of Children: N/A  . Years of Education: N/A   Occupational History  . Retired    Social History Main Topics  . Smoking status: Never Smoker   . Smokeless tobacco: Not on file  . Alcohol Use: Yes     Comment: 1-2 PER WEEK  . Drug Use: No  . Sexual Activity: Not on file     Comment: Husband decease   Other Topics Concern  . Not on file   Social History Narrative  . No narrative on file    Allergies  Allergen Reactions  . Amoxicillin     REACTION: rash  . Penicillins     REACTION: rash  . Sulfonamide Derivatives     REACTION: rash     Constitutional: Positive headache, fatigue and fever. Denies abrupt weight changes.  HEENT:  Positive ear fullness, sore throat. Denies eye redness, eye pain, pressure behind the eyes, facial pain, nasal congestion, ear pain, ringing in the ears, wax buildup, runny nose or bloody nose. Respiratory: Positive cough. Denies difficulty breathing or shortness of  breath.  Cardiovascular: Denies chest pain, chest tightness, palpitations or swelling in the hands or feet.   No other specific complaints in a complete review of systems (except as listed in HPI above).  Objective:   BP 118/72  Pulse 76  Temp(Src) 97.6 F (36.4 C) (Oral)  Wt 191 lb (86.637 kg)  SpO2 98% Wt Readings from Last 3 Encounters:  12/31/13 191 lb (86.637 kg)  07/29/13 187 lb (84.823 kg)  04/24/13 193 lb (87.544 kg)     General: Appears her stated age, overweight but well developed, well nourished in NAD. HEENT: Head: normal shape and size; Eyes: sclera white, no icterus, conjunctiva pink, PERRLA and EOMs intact; Ears: Tm's gray and intact, normal light reflex, + effusions bilaterally; Nose: mucosa pink and moist, septum midline; Throat/Mouth: Teeth present, mucosa pink and moist, no exudate noted, no lesions or ulcerations noted.  Neck: Neck supple, trachea midline. No massses, lumps or thyromegaly present.  Cardiovascular: Normal rate and rhythm. S1,S2 noted.  No murmur, rubs or gallops noted. No JVD or BLE edema. No carotid bruits noted. Pulmonary/Chest: Normal effort and positive vesicular breath sounds. No respiratory distress. No wheezes, rales or ronchi noted.      Assessment &  Plan:   Upper Respiratory Infection, likely viral at this point:  Get some rest and drink plenty of water Do salt water gargles for the sore throat I would stop the mucinex for now [facility] to take Ibuprofen for body aches/fever  RTC as needed or if symptoms persist.

## 2013-12-31 NOTE — Patient Instructions (Signed)

## 2013-12-31 NOTE — Progress Notes (Signed)
Pre-visit discussion using our clinic review tool. No additional management support is needed unless otherwise documented below in the visit note.  

## 2014-01-07 DIAGNOSIS — H532 Diplopia: Secondary | ICD-10-CM | POA: Diagnosis not present

## 2014-01-27 ENCOUNTER — Ambulatory Visit: Payer: Medicare Other | Admitting: Podiatry

## 2014-03-30 DIAGNOSIS — H532 Diplopia: Secondary | ICD-10-CM | POA: Diagnosis not present

## 2014-03-31 ENCOUNTER — Ambulatory Visit: Payer: Medicare Other | Admitting: Podiatry

## 2014-04-19 ENCOUNTER — Encounter: Payer: Self-pay | Admitting: Family Medicine

## 2014-04-19 ENCOUNTER — Ambulatory Visit (INDEPENDENT_AMBULATORY_CARE_PROVIDER_SITE_OTHER): Payer: Medicare Other | Admitting: Family Medicine

## 2014-04-19 VITALS — BP 126/66 | HR 60 | Temp 97.8°F | Ht 68.0 in | Wt 192.2 lb

## 2014-04-19 DIAGNOSIS — E0590 Thyrotoxicosis, unspecified without thyrotoxic crisis or storm: Secondary | ICD-10-CM

## 2014-04-19 DIAGNOSIS — G5790 Unspecified mononeuropathy of unspecified lower limb: Secondary | ICD-10-CM | POA: Diagnosis not present

## 2014-04-19 DIAGNOSIS — G5792 Unspecified mononeuropathy of left lower limb: Secondary | ICD-10-CM

## 2014-04-19 LAB — COMPREHENSIVE METABOLIC PANEL
ALBUMIN: 4 g/dL (ref 3.5–5.2)
ALT: 20 U/L (ref 0–35)
AST: 15 U/L (ref 0–37)
Alkaline Phosphatase: 52 U/L (ref 39–117)
BUN: 23 mg/dL (ref 6–23)
CALCIUM: 9.4 mg/dL (ref 8.4–10.5)
CHLORIDE: 99 meq/L (ref 96–112)
CO2: 30 mEq/L (ref 19–32)
CREATININE: 1 mg/dL (ref 0.4–1.2)
GFR: 57.92 mL/min — ABNORMAL LOW (ref >60.00)
GLUCOSE: 98 mg/dL (ref 70–99)
POTASSIUM: 4 meq/L (ref 3.5–5.1)
Sodium: 138 mEq/L (ref 135–145)
Total Bilirubin: 0.9 mg/dL (ref 0.2–1.2)
Total Protein: 7.3 g/dL (ref 6.0–8.3)

## 2014-04-19 LAB — HEMOGLOBIN A1C: HEMOGLOBIN A1C: 6.1 % (ref 4.6–6.5)

## 2014-04-19 LAB — TSH: TSH: 0.54 u[IU]/mL (ref 0.35–4.50)

## 2014-04-19 LAB — VITAMIN B12: VITAMIN B 12: 292 pg/mL (ref 211–911)

## 2014-04-19 MED ORDER — PREDNISONE 20 MG PO TABS: ORAL_TABLET | ORAL | Status: DC

## 2014-04-19 MED ORDER — EZETIMIBE-SIMVASTATIN 10-40 MG PO TABS: 1.0000 | ORAL_TABLET | Freq: Every day | ORAL | Status: DC

## 2014-04-19 MED ORDER — SPIRONOLACTONE-HCTZ 25-25 MG PO TABS: 1.0000 | ORAL_TABLET | Freq: Every day | ORAL | Status: DC

## 2014-04-19 MED ORDER — LEVOTHYROXINE SODIUM 112 MCG PO TABS: ORAL_TABLET | ORAL | Status: DC

## 2014-04-19 NOTE — Patient Instructions (Signed)
Good to see you. Please take prednisone as directed and with food (in the morning).  Call me with an update when you have finished.

## 2014-04-19 NOTE — Progress Notes (Signed)
Subjective:   Patient ID: Susan Byrd, female    DOB: 1946-01-04, 68 y.o.   MRN: [id number]  Susan Byrd is a pleasant 68 y.o. year old female who presents to clinic today with Numbness and Tingling  on 04/19/2014  HPI: Almost 6 months of tingling in left foot. About a month prior, twisted left knee and had some knee soreness but this resolved.  Had acute onset, ball of left foot and heel was hurting and this resolved but numbness has persisted.  Saw podiatrist, Dr. Milinda Pointer in 12/2013.   Did xray of left foot and gave her a shot in her foot but she is not sure what her diagnosis was.  In past, he treated her for a morton's neuroma.  This did not help.  Still having tingling, mainly in 3-5 left toes. No tripping or difficulty walking. NO HA or blurred vision.  Does seem to be worse when she is barefoot.  H/o hyperthyroidism. Lab Results  Component Value Date   TSH 0.752 04/24/2013     Patient Active Problem List   Diagnosis Date Noted  . Neuropathy of left foot 04/19/2014  . Interdigital neuroma   . Grief reaction 07/08/2012  . Routine general medical examination at a health care facility 03/13/2011  . LUMP OR MASS IN BREAST 10/18/2010  . HYPERTHYROIDISM 05/11/2010  . HYPERLIPIDEMIA 05/11/2010  . MIGRAINE HEADACHE 05/11/2010  . HYPERTENSION 05/11/2010  . VAGINITIS, ATROPHIC 05/11/2010   Past Medical History  Diagnosis Date  . Hyperlipidemia   . Hypertension   . Thyroid disease   . Migraine   . High cholesterol   . Interdigital neuroma 07/29/2013    3RD IDS LEFT FOOT   Past Surgical History  Procedure Laterality Date  . Cholecystectomy    . Abdominal hysterectomy    . Bunionectomy     History  Substance Use Topics  . Smoking status: Never Smoker   . Smokeless tobacco: Not on file  . Alcohol Use: Yes     Comment: 1-2 PER WEEK   Family History  Problem Relation Age of Onset  . Cancer Mother     bone  . Stroke Father   . Heart disease Brother   .  Hypertension Brother   . Heart disease Sister   . Hypertension Sister    Allergies  Allergen Reactions  . Amoxicillin     REACTION: rash  . Penicillins     REACTION: rash  . Sulfonamide Derivatives     REACTION: rash   Current Outpatient Prescriptions on File Prior to Visit  Medication Sig Dispense Refill  . clobetasol (TEMOVATE) 0.05 % cream Apply to vulva 2-3 times per week  30 g  6  . latanoprost (XALATAN) 0.005 % ophthalmic solution Place 1 drop into both eyes daily.        . SUMAtriptan (IMITREX) 100 MG tablet One tablet by mouth at the onset of migraine.  May repeat dose in one hour if headaches not resolved        No current facility-administered medications on file prior to visit.   The PMH, PSH, Social History, Family History, Medications, and allergies have been reviewed in [facility], and have been updated if relevant.   Review of Systems    See HPI No foot pain Objective:    BP 126/66  Pulse 60  Temp(Src) 97.8 F (36.6 C) (Oral)  Ht 5\' 8"  (1.727 m)  Wt 192 lb 4 oz (87.204 kg)  BMI 29.24 kg/m2  SpO2 96%   Physical Exam  Constitutional: She appears well-developed and well-nourished. No distress.  Musculoskeletal: Normal range of motion.       Left ankle: Normal. She exhibits normal range of motion, no swelling, no ecchymosis, no deformity, no laceration and normal pulse.  Left foot unremarkable Normal sensation Normal pulses and reflexes         Assessment & Plan:   Neuropathy of left foot No Follow-up on file.

## 2014-04-19 NOTE — Progress Notes (Signed)
Pre visit review using our clinic review tool, if applicable. No additional management support is needed unless otherwise documented below in the visit note. 

## 2014-04-19 NOTE — Assessment & Plan Note (Addendum)
?  impingement. Does have h/o arthritis in that foot. Will check labs today, place on course of prednisone. If no improvement, consider referral back to podiatry vs neuro work up.  NO red flag neuro symptoms today. The patient indicates understanding of these issues and agrees with the plan.

## 2014-06-16 ENCOUNTER — Ambulatory Visit (INDEPENDENT_AMBULATORY_CARE_PROVIDER_SITE_OTHER): Payer: Medicare Other | Admitting: Family Medicine

## 2014-06-16 ENCOUNTER — Encounter: Payer: Self-pay | Admitting: Family Medicine

## 2014-06-16 VITALS — BP 114/71 | HR 63 | Temp 98.2°F | Ht 68.0 in | Wt 191.8 lb

## 2014-06-16 DIAGNOSIS — R200 Anesthesia of skin: Secondary | ICD-10-CM

## 2014-06-16 DIAGNOSIS — M79672 Pain in left foot: Secondary | ICD-10-CM

## 2014-06-16 DIAGNOSIS — R209 Unspecified disturbances of skin sensation: Secondary | ICD-10-CM | POA: Diagnosis not present

## 2014-06-16 DIAGNOSIS — M79609 Pain in unspecified limb: Secondary | ICD-10-CM | POA: Diagnosis not present

## 2014-06-16 NOTE — Progress Notes (Signed)
Pre visit review using our clinic review tool, if applicable. No additional management support is needed unless otherwise documented below in the visit note. 

## 2014-06-16 NOTE — Progress Notes (Signed)
[place] [address] Phone: [phone] Fax: [phone]  Patient ID: Susan Byrd MRN: [id number], DOB: August 25, 1946, 68 y.o. Date of Encounter: 06/16/2014  Primary Physician:  Arnette Norris, MD   Chief Complaint: Foot Pain   Subjective:   History of Present Illness:  Susan Byrd is a 68 y.o. very pleasant female patient who presents with the following:  Christmas and new years last years and arch on the outside started to feel some pressure and when on vacation and arch and pain up her heel. And thought ? PF and numb on the left quadrant of her foot. Saw Dr. Milinda Pointer last year.   Had a neuroma injection on the 2-3. On the L. When I talk about this a little bit more, she is having some numbness altered sensation on the lateral aspect of the LEFT foot, but she is also having numbness and altered sensation on the lateral aspect of the lower extremity. She is not really having any significant back pain. No significant for terribly bad buttocks pain.  End of May, took some oral prednisone. The numbness went away a little bit.   Very stiff and pain with top of foot.   Past Medical History, Surgical History, Social History, Family History, Problem List, Medications, and Allergies have been reviewed and updated if relevant.  Review of Systems: GEN: no acute illness or fever CV: No chest pain or shortness of breath MSK: detailed above Neuro: neurological signs are described above O/w per HPI  Objective:   Physical Examination: BP 114/71  Pulse 63  Temp(Src) 98.2 F (36.8 C) (Oral)  Ht 5\' 8"  (1.727 m)  Wt 191 lb 12 oz (86.977 kg)  BMI 29.16 kg/m2   GEN: WDWN, NAD, Non-toxic, Alert & Oriented x 3 HEENT: Atraumatic, Normocephalic.  Ears and Nose: No external deformity. EXTR: No clubbing/cyanosis/edema PSYCH: Normally interactive. Conversant. Not depressed or anxious appearing.  Calm demeanor.    nontender at the ankle. Full range of motion at the  ankle without any significant tenderness. The patient naturally have a cavus foot. With sitting and standing, the patient has a tremendous amount of forefoot breakdown with transverse arch collapse, multiple toes that are splayed, bunion and bunionette formation. She is status post bunion surgery on the RIGHT. Notable bunionette and bunion formation on the LEFT as well.  Slightly decreased sensation on the lateral aspect of the lower extremity as well as lateral aspect of the foot.  Her back is grossly nontender to palpation without any significant muscle spasm. She is nontender lumbar spinous processes. Pelvic ribs are nontender and the upper buttocks and general are grossly nontender.  Radiology: No results found.  Assessment & Plan:   Lower extremity numbness  Foot pain, left  >25 minutes spent in face to face time with patient, >50% spent in counselling or coordination of care  This is a classic L4 or L5 distribution pattern, suggesting that the patient's altered sensation and numbness has a spinal origin.  She certainly has a number of foot problems, foot breakdown, foot pain, bunion, bunionette, and a history of neuromas, but I do not think that these would be the cause of her neurological changes on her lateral lower extremity.  We discussed various treatment options, and the patient wanted to be as conservative as possible. I reviewed a rehabilitation program for spinal stability, hip range of motion, and core work from the [employer] [facility] [employer] consultant. She would like to proceed with this  only at this point.  Neuropathic pain modulators were discussed with the patient. These potentially would include Gabapentin, Lyrica, TCA's, Cymbalta, or Savella. She is not interested.  New Prescriptions   No medications on file   Modified Medications   No medications on file   No orders of the defined types were placed in this encounter.   Follow-up: No Follow-up on file. Unless  noted above, the patient is to follow-up if symptoms worsen. Red flags were reviewed with the patient.  Signed,  Maud Deed. Aubrey Voong, MD, CA[facility] Sports Medicine   Discontinued Medications   PREDNISONE (DELTASONE) 20 MG TABLET    2 tabs by mouth x 3 days, then 1 tab by mouth x 3 days, 1/2 tab by mouth x 2 days and stop   Current Medications at Discharge:   Medication List       This list is accurate as of: 06/16/14 11:59 PM.  Always use your most recent med list.               clobetasol cream 0.05 %  Commonly known as:  TEMOVATE  Apply to vulva 2-3 times per week     Estradiol 10 MCG Tabs vaginal tablet  One tablet three times per week     ezetimibe-simvastatin 10-40 MG per tablet  Commonly known as:  VYTORIN  Take 1 tablet by mouth daily. Please schedule appointment with Dr for any additional refills.     latanoprost 0.005 % ophthalmic solution  Commonly known as:  XALATAN  Place 1 drop into both eyes daily.     levothyroxine 112 MCG tablet  Commonly known as:  SYNTHROID, LEVOTHROID  TAKE 1 TABLET (112 MCG TOTAL) BY MOUTH DAILY.     spironolactone-hydrochlorothiazide 25-25 MG per tablet  Commonly known as:  ALDACTAZIDE  Take 1 tablet by mouth daily.     SUMAtriptan 100 MG tablet  Commonly known as:  IMITREX  One tablet by mouth at the onset of migraine.  May repeat dose in one hour if headaches not resolved

## 2014-06-28 ENCOUNTER — Other Ambulatory Visit: Payer: Self-pay

## 2014-06-28 DIAGNOSIS — Z1231 Encounter for screening mammogram for malignant neoplasm of breast: Secondary | ICD-10-CM

## 2014-06-30 ENCOUNTER — Other Ambulatory Visit: Payer: Self-pay | Admitting: Family Medicine

## 2014-06-30 ENCOUNTER — Other Ambulatory Visit (INDEPENDENT_AMBULATORY_CARE_PROVIDER_SITE_OTHER): Payer: Medicare Other

## 2014-06-30 DIAGNOSIS — E0590 Thyrotoxicosis, unspecified without thyrotoxic crisis or storm: Secondary | ICD-10-CM | POA: Diagnosis not present

## 2014-06-30 DIAGNOSIS — E785 Hyperlipidemia, unspecified: Secondary | ICD-10-CM

## 2014-06-30 DIAGNOSIS — Z0000 Encounter for general adult medical examination without abnormal findings: Secondary | ICD-10-CM

## 2014-06-30 DIAGNOSIS — I10 Essential (primary) hypertension: Secondary | ICD-10-CM

## 2014-06-30 LAB — CBC WITH DIFFERENTIAL/PLATELET
BASOS PCT: 0.4 % (ref 0.0–3.0)
Basophils Absolute: 0 10*3/uL (ref 0.0–0.1)
EOS PCT: 1 % (ref 0.0–5.0)
Eosinophils Absolute: 0.1 10*3/uL (ref 0.0–0.7)
HCT: 45.4 % (ref 36.0–46.0)
HEMOGLOBIN: 15.2 g/dL — AB (ref 12.0–15.0)
LYMPHS PCT: 27.5 % (ref 12.0–46.0)
Lymphs Abs: 1.7 10*3/uL (ref 0.7–4.0)
MCHC: 33.5 g/dL (ref 30.0–36.0)
MCV: 90.3 fl (ref 78.0–100.0)
MONOS PCT: 7.2 % (ref 3.0–12.0)
Monocytes Absolute: 0.5 10*3/uL (ref 0.1–1.0)
NEUTROS ABS: 4 10*3/uL (ref 1.4–7.7)
NEUTROS PCT: 63.9 % (ref 43.0–77.0)
Platelets: 213 10*3/uL (ref 150.0–400.0)
RBC: 5.02 Mil/uL (ref 3.87–5.11)
RDW: 14 % (ref 11.5–15.5)
WBC: 6.3 10*3/uL (ref 4.0–10.5)

## 2014-06-30 LAB — LIPID PANEL
CHOL/HDL RATIO: 3
CHOLESTEROL: 113 mg/dL (ref 0–200)
HDL: 37.5 mg/dL — ABNORMAL LOW (ref >39.00)
LDL Cholesterol: 40 mg/dL (ref 0–99)
NonHDL: 75.5
TRIGLYCERIDES: 177 mg/dL — AB (ref 0.0–149.0)
VLDL: 35.4 mg/dL (ref 0.0–40.0)

## 2014-06-30 LAB — COMPREHENSIVE METABOLIC PANEL
ALBUMIN: 3.9 g/dL (ref 3.5–5.2)
ALT: 22 U/L (ref 0–35)
AST: 17 U/L (ref 0–37)
Alkaline Phosphatase: 51 U/L (ref 39–117)
BUN: 17 mg/dL (ref 6–23)
CALCIUM: 9.4 mg/dL (ref 8.4–10.5)
CHLORIDE: 103 meq/L (ref 96–112)
CO2: 29 meq/L (ref 19–32)
Creatinine, Ser: 0.9 mg/dL (ref 0.4–1.2)
GFR: 63.67 mL/min (ref >60.00)
GLUCOSE: 112 mg/dL — AB (ref 70–99)
POTASSIUM: 3.7 meq/L (ref 3.5–5.1)
Sodium: 140 mEq/L (ref 135–145)
Total Bilirubin: 0.7 mg/dL (ref 0.2–1.2)
Total Protein: 6.9 g/dL (ref 6.0–8.3)

## 2014-06-30 LAB — T4, FREE: Free T4: 1.33 ng/dL (ref 0.60–1.60)

## 2014-06-30 LAB — TSH: TSH: 0.12 u[IU]/mL — ABNORMAL LOW (ref 0.35–4.50)

## 2014-07-07 ENCOUNTER — Encounter: Payer: Self-pay | Admitting: Family Medicine

## 2014-07-07 ENCOUNTER — Ambulatory Visit (INDEPENDENT_AMBULATORY_CARE_PROVIDER_SITE_OTHER): Payer: Medicare Other | Admitting: Family Medicine

## 2014-07-07 ENCOUNTER — Ambulatory Visit
Admission: RE | Admit: 2014-07-07 | Discharge: 2014-07-07 | Disposition: A | Discharge status: Home / Self Care | Payer: Medicare Other | Source: Ambulatory Visit

## 2014-07-07 VITALS — BP 118/70 | HR 65 | Temp 97.8°F | Ht 67.75 in | Wt 187.2 lb

## 2014-07-07 DIAGNOSIS — G5790 Unspecified mononeuropathy of unspecified lower limb: Secondary | ICD-10-CM | POA: Diagnosis not present

## 2014-07-07 DIAGNOSIS — Z1231 Encounter for screening mammogram for malignant neoplasm of breast: Secondary | ICD-10-CM

## 2014-07-07 DIAGNOSIS — F4321 Adjustment disorder with depressed mood: Secondary | ICD-10-CM

## 2014-07-07 DIAGNOSIS — Z0000 Encounter for general adult medical examination without abnormal findings: Secondary | ICD-10-CM | POA: Diagnosis not present

## 2014-07-07 DIAGNOSIS — Z23 Encounter for immunization: Secondary | ICD-10-CM | POA: Diagnosis not present

## 2014-07-07 DIAGNOSIS — E785 Hyperlipidemia, unspecified: Secondary | ICD-10-CM

## 2014-07-07 DIAGNOSIS — E0590 Thyrotoxicosis, unspecified without thyrotoxic crisis or storm: Secondary | ICD-10-CM

## 2014-07-07 DIAGNOSIS — G5792 Unspecified mononeuropathy of left lower limb: Secondary | ICD-10-CM

## 2014-07-07 DIAGNOSIS — F4320 Adjustment disorder, unspecified: Secondary | ICD-10-CM

## 2014-07-07 MED ORDER — SPIRONOLACTONE-HCTZ 25-25 MG PO TABS: 1.0000 | ORAL_TABLET | Freq: Every day | ORAL | Status: DC

## 2014-07-07 MED ORDER — EZETIMIBE-SIMVASTATIN 10-20 MG PO TABS: 1.0000 | ORAL_TABLET | Freq: Every day | ORAL | Status: DC

## 2014-07-07 MED ORDER — LEVOTHYROXINE SODIUM 100 MCG PO TABS: ORAL_TABLET | ORAL | Status: DC

## 2014-07-07 NOTE — Assessment & Plan Note (Signed)
Improved with diet and exercise. Decrease vytorin dose to 10-20 daily. Follow up labs in 8 weeks. The patient indicates understanding of these issues and agrees with the plan.

## 2014-07-07 NOTE — Progress Notes (Signed)
Subjective:   Patient ID: Susan Byrd, female    DOB: [birth year], 68 y.o.   MRN: [id number]  Susan Byrd is a pleasant 68 y.o. year old female who presents to clinic today with Annual Exam and Numbness  on 07/07/2014  HPI: I have personally reviewed the Medicare Annual Wellness questionnaire and have noted 1. The patient's medical and social history 2. Their use of alcohol, tobacco or illicit drugs 3. Their current medications and supplements 4. The patient's functional ability including ADL's, fall risks, home safety risks and hearing or visual             impairment. 5. Diet and physical activities 6. Evidence for depression or mood disorders  Pneumovax 02/26/12 Td 02/26/12 Zoster 09/14/10 Mammo scheduled for this afternoon. S/p hysterectomy- saw Dr. Hulan Fray on 02/23/13- note reviewed. colonoscopy 10/24/10 Eye exam- 03/30/14- Dr. Dawna Part  End of life wishes discussed and updated in Social History.  The roster of all physicians providing medical care to patient - is listed in the Snapshot section of the chart.   Hypothyroidism- on Synthroid 112 micrograms daily since last year.    TSH low but Ft4 normal.  Has had more hot flashes lately and has lost a few pounds but working on decreasing fatty foods. Wt Readings from Last 3 Encounters:  07/07/14 187 lb 4 oz (84.936 kg)  06/16/14 191 lb 12 oz (86.977 kg)  04/19/14 192 lb 4 oz (87.204 kg)    Otherwise, denies any symptoms of hypo or hyperthyroidism. Lab Results  Component Value Date   TSH 0.12* 06/30/2014    HLD- has been on Vytorin 10-40 mg.  LDL much improved.  Has cut back on fatty foods.  Lab Results  Component Value Date   CHOL 113 06/30/2014   HDL 37.50* 06/30/2014   LDLCALC 40 06/30/2014   TRIG 177.0* 06/30/2014   CHOLHDL 3 06/30/2014      HTN- has been on Spironolactone/HCTZ 25/25 daily.  No CP, SOB, LE edema or blurred vision.  Lab Results  Component Value Date   CREATININE 0.9 06/30/2014    Left foot  numbness- Dr. Milinda Pointer saw her on 12/30/13 for a neuroma- received cortisone injection.  referred her to Dr. Lorelei Pont for this. Saw him on 06/16/2014- note reviewed.  He felt this was multifactorial- foot breakdown, bunion, h/o neuromas but he did not feel they explained her neurological changes of her left lateral extremity.   Suggested rehab exercises for her back which she agreed to and have not helped.  Declined neuropathic pain med at that time.  Symptoms have not improved.  Worsened when barefoot.  Current Outpatient Prescriptions on File Prior to Visit  Medication Sig Dispense Refill  . clobetasol (TEMOVATE) 0.05 % cream Apply to vulva 2-3 times per week  30 g  6  . Estradiol 10 MCG TABS vaginal tablet One tablet three times per week      . ezetimibe-simvastatin (VYTORIN) 10-40 MG per tablet Take 1 tablet by mouth daily. Please schedule appointment with Dr for any additional refills.  90 tablet  0  . latanoprost (XALATAN) 0.005 % ophthalmic solution Place 1 drop into both eyes daily.        Marland Kitchen levothyroxine (SYNTHROID, LEVOTHROID) 112 MCG tablet TAKE 1 TABLET (112 MCG TOTAL) BY MOUTH DAILY.  90 tablet  0  . spironolactone-hydrochlorothiazide (ALDACTAZIDE) 25-25 MG per tablet Take 1 tablet by mouth daily.  90 tablet  0  . SUMAtriptan (IMITREX) 100 MG tablet One  tablet by mouth at the onset of migraine.  May repeat dose in one hour if headaches not resolved        No current facility-administered medications on file prior to visit.    Allergies  Allergen Reactions  . Amoxicillin     REACTION: rash  . Penicillins     REACTION: rash  . Sulfonamide Derivatives     REACTION: rash    Past Medical History  Diagnosis Date  . Hyperlipidemia   . Hypertension   . Thyroid disease   . Migraine   . High cholesterol   . Interdigital neuroma 07/29/2013    3RD IDS LEFT FOOT    Past Surgical History  Procedure Laterality Date  . Cholecystectomy    . Abdominal hysterectomy    . Bunionectomy       Family History  Problem Relation Age of Onset  . Cancer Mother     bone  . Stroke Father   . Heart disease Brother   . Hypertension Brother   . Heart disease Sister   . Hypertension Sister     History   Social History  . Marital Status: Married    Spouse Name: N/A    Number of Children: N/A  . Years of Education: N/A   Occupational History  . Retired    Social History Main Topics  . Smoking status: Never Smoker   . Smokeless tobacco: Never Used  . Alcohol Use: Yes     Comment: 1-2 PER WEEK  . Drug Use: No  . Sexual Activity: Not on file     Comment: Husband decease   Other Topics Concern  . Not on file   Social History Narrative   Widowed 2014   Does have a living will.   Desires CPR- would not want prolonged life support if futile.   The PMH, PSH, Social History, Family History, Medications, and allergies have been reviewed in [facility], and have been updated if relevant.    Review of Systems    See HPI Denies any changes in bowel habits No blood in stool No tremor or anxiety Sleeping ok Appetite good- intentional weight loss Wt Readings from Last 3 Encounters:  07/07/14 187 lb 4 oz (84.936 kg)  06/16/14 191 lb 12 oz (86.977 kg)  04/19/14 192 lb 4 oz (87.204 kg)   No CP or SOB  Objective:    BP 118/70  Pulse 65  Temp(Src) 97.8 F (36.6 C) (Oral)  Ht 5' 7.75" (1.721 m)  Wt 187 lb 4 oz (84.936 kg)  BMI 28.68 kg/m2  SpO2 95%   Physical Exam  Nursing note and vitals reviewed. Constitutional: She is oriented to person, place, and time. She appears well-developed and well-nourished. No distress.  HENT:  Head: Normocephalic and atraumatic.  Eyes: Pupils are equal, round, and reactive to light.  Neck: Normal range of motion. Neck supple. No thyromegaly present.  Pulmonary/Chest: Effort normal and breath sounds normal.  Abdominal: Soft. Bowel sounds are normal. She exhibits no distension.  Musculoskeletal: Normal range of motion.    Neurological: She is alert and oriented to person, place, and time. No cranial nerve deficit. Coordination normal.  No tremor  Skin: Skin is warm and dry.  Psychiatric: She has a normal mood and affect. Her behavior is normal. Judgment and thought content normal.          Assessment & Plan:   Neuropathy of left foot  Medicare annual wellness visit, subsequent  HYPERTHYROIDISM  HYPERLIPIDEMIA  No Follow-up on file.

## 2014-07-07 NOTE — Progress Notes (Signed)
Pre visit review using our clinic review tool, if applicable. No additional management support is needed unless otherwise documented below in the visit note. 

## 2014-07-07 NOTE — Assessment & Plan Note (Signed)
At this point, MRI of lumbar spine is warranted.  She would prefer ortho referral.  Referral placed.

## 2014-07-07 NOTE — Assessment & Plan Note (Signed)
Clinically overcorrected- low TSH with normal FT4. Decrease synthroid to 100 mcg daily.  Follow up labs in 8 weeks. The patient indicates understanding of these issues and agrees with the plan.

## 2014-07-07 NOTE — Assessment & Plan Note (Signed)
The patients weight, height, BMI and visual acuity have been recorded in the chart I have made referrals, counseling and provided education to the patient based review of the above and I have provided the pt with a written personalized care plan for preventive services.  Prevnar 13 today. Mammogram scheduled for later today. She will return for flu shot later this month once we receive them.

## 2014-07-07 NOTE — Patient Instructions (Addendum)
Great to see you. We are decreasing: 1.  Synthroid to 100 mcg daily  2.  Vytorin 10-20 daily  Please return in 8 weeks for fasting lab work.   We will call you with your ortho referral.

## 2014-07-07 NOTE — Assessment & Plan Note (Signed)
Appropriate rxn at this point. She will continue to update me with her symptoms. I offered my condolences and support.

## 2014-07-07 NOTE — Addendum Note (Signed)
Addended by: Modena Nunnery on: 07/07/2014 10:56 AM   Modules accepted: Orders

## 2014-09-08 DIAGNOSIS — M1712 Unilateral primary osteoarthritis, left knee: Secondary | ICD-10-CM | POA: Diagnosis not present

## 2014-09-08 DIAGNOSIS — M5416 Radiculopathy, lumbar region: Secondary | ICD-10-CM | POA: Diagnosis not present

## 2014-09-16 ENCOUNTER — Ambulatory Visit: Payer: Self-pay | Admitting: Orthopedic Surgery

## 2014-09-16 DIAGNOSIS — M25562 Pain in left knee: Secondary | ICD-10-CM | POA: Diagnosis not present

## 2014-09-16 DIAGNOSIS — M5137 Other intervertebral disc degeneration, lumbosacral region: Secondary | ICD-10-CM | POA: Diagnosis not present

## 2014-09-16 DIAGNOSIS — M4806 Spinal stenosis, lumbar region: Secondary | ICD-10-CM | POA: Diagnosis not present

## 2014-09-16 DIAGNOSIS — M25552 Pain in left hip: Secondary | ICD-10-CM | POA: Diagnosis not present

## 2014-09-16 DIAGNOSIS — M47816 Spondylosis without myelopathy or radiculopathy, lumbar region: Secondary | ICD-10-CM | POA: Diagnosis not present

## 2014-09-16 DIAGNOSIS — R208 Other disturbances of skin sensation: Secondary | ICD-10-CM | POA: Diagnosis not present

## 2014-09-16 DIAGNOSIS — M179 Osteoarthritis of knee, unspecified: Secondary | ICD-10-CM | POA: Diagnosis not present

## 2014-09-16 DIAGNOSIS — M1712 Unilateral primary osteoarthritis, left knee: Secondary | ICD-10-CM | POA: Diagnosis not present

## 2014-09-22 DIAGNOSIS — M5416 Radiculopathy, lumbar region: Secondary | ICD-10-CM | POA: Diagnosis not present

## 2014-09-22 DIAGNOSIS — M1712 Unilateral primary osteoarthritis, left knee: Secondary | ICD-10-CM | POA: Diagnosis not present

## 2014-09-23 ENCOUNTER — Ambulatory Visit (INDEPENDENT_AMBULATORY_CARE_PROVIDER_SITE_OTHER): Payer: PRIVATE HEALTH INSURANCE

## 2014-09-23 ENCOUNTER — Other Ambulatory Visit (INDEPENDENT_AMBULATORY_CARE_PROVIDER_SITE_OTHER): Payer: Medicare Other

## 2014-09-23 DIAGNOSIS — E0590 Thyrotoxicosis, unspecified without thyrotoxic crisis or storm: Secondary | ICD-10-CM

## 2014-09-23 DIAGNOSIS — E785 Hyperlipidemia, unspecified: Secondary | ICD-10-CM | POA: Diagnosis not present

## 2014-09-23 DIAGNOSIS — Z23 Encounter for immunization: Secondary | ICD-10-CM

## 2014-09-23 LAB — LIPID PANEL
CHOLESTEROL: 176 mg/dL (ref 0–200)
HDL: 41.5 mg/dL (ref >39.00)
LDL CALC: 103 mg/dL — AB (ref 0–99)
NonHDL: 134.5
TRIGLYCERIDES: 158 mg/dL — AB (ref 0.0–149.0)
Total CHOL/HDL Ratio: 4
VLDL: 31.6 mg/dL (ref 0.0–40.0)

## 2014-09-23 LAB — COMPREHENSIVE METABOLIC PANEL
ALT: 16 U/L (ref 0–35)
AST: 19 U/L (ref 0–37)
Albumin: 3.6 g/dL (ref 3.5–5.2)
Alkaline Phosphatase: 58 U/L (ref 39–117)
BUN: 17 mg/dL (ref 6–23)
CO2: 30 meq/L (ref 19–32)
Calcium: 9.3 mg/dL (ref 8.4–10.5)
Chloride: 103 mEq/L (ref 96–112)
Creatinine, Ser: 1 mg/dL (ref 0.4–1.2)
GFR: 57.84 mL/min — AB (ref >60.00)
GLUCOSE: 92 mg/dL (ref 70–99)
Potassium: 4.2 mEq/L (ref 3.5–5.1)
SODIUM: 138 meq/L (ref 135–145)
TOTAL PROTEIN: 7.3 g/dL (ref 6.0–8.3)
Total Bilirubin: 1 mg/dL (ref 0.2–1.2)

## 2014-09-24 LAB — TSH: TSH: 0.56 u[IU]/mL (ref 0.35–4.50)

## 2014-09-24 LAB — T4, FREE: FREE T4: 1.18 ng/dL (ref 0.60–1.60)

## 2014-09-24 LAB — T3, FREE: T3, Free: 2.6 pg/mL (ref 2.3–4.2)

## 2014-09-27 DIAGNOSIS — H2513 Age-related nuclear cataract, bilateral: Secondary | ICD-10-CM | POA: Diagnosis not present

## 2014-09-29 ENCOUNTER — Encounter: Payer: Self-pay | Admitting: *Deleted

## 2014-10-04 ENCOUNTER — Encounter: Payer: Self-pay | Admitting: Family Medicine

## 2014-11-02 ENCOUNTER — Other Ambulatory Visit: Payer: Self-pay | Admitting: *Deleted

## 2014-11-02 MED ORDER — LEVOTHYROXINE SODIUM 100 MCG PO TABS: ORAL_TABLET | ORAL | Status: DC

## 2014-11-02 MED ORDER — SPIRONOLACTONE-HCTZ 25-25 MG PO TABS: 1.0000 | ORAL_TABLET | Freq: Every day | ORAL | Status: DC

## 2014-11-02 NOTE — Addendum Note (Signed)
Addended by: Modena Nunnery on: 11/02/2014 04:29 PM   Modules accepted: Orders

## 2014-12-16 ENCOUNTER — Other Ambulatory Visit: Payer: Self-pay | Admitting: Internal Medicine

## 2014-12-16 ENCOUNTER — Telehealth: Payer: Self-pay

## 2014-12-16 MED ORDER — BENZONATATE 100 MG PO CAPS: 100.0000 mg | ORAL_CAPSULE | Freq: Two times a day (BID) | ORAL | Status: DC | PRN

## 2014-12-16 NOTE — Telephone Encounter (Signed)
benzonate sent to [facility]. Will need follow up if worse.

## 2014-12-16 NOTE — Telephone Encounter (Signed)
Pt left v/m; pt has non prod cough that she gets ever couple of years; Some chest and head congestion. No fever, S/T or SOB or wheezing.Pt request refill benzonate 100 mg, pt said last filled 10/18/2010. Pt does not want to schedule appt. Pt last seen 07/07/2014.[employer].

## 2014-12-17 NOTE — Telephone Encounter (Signed)
Left message on voicemail.

## 2015-01-24 ENCOUNTER — Other Ambulatory Visit: Payer: Self-pay | Admitting: *Deleted

## 2015-01-24 MED ORDER — SPIRONOLACTONE-HCTZ 25-25 MG PO TABS: 1.0000 | ORAL_TABLET | Freq: Every day | ORAL | Status: DC

## 2015-01-26 MED ORDER — SPIRONOLACTONE-HCTZ 25-25 MG PO TABS: 1.0000 | ORAL_TABLET | Freq: Every day | ORAL | Status: DC

## 2015-01-26 MED ORDER — LEVOTHYROXINE SODIUM 100 MCG PO TABS: ORAL_TABLET | ORAL | Status: DC

## 2015-01-26 NOTE — Addendum Note (Signed)
Addended by: Modena Nunnery on: 01/26/2015 04:53 PM   Modules accepted: Orders

## 2015-01-31 MED ORDER — LEVOTHYROXINE SODIUM 100 MCG PO TABS: ORAL_TABLET | ORAL | Status: DC

## 2015-01-31 NOTE — Addendum Note (Signed)
Addended by: Modena Nunnery on: 01/31/2015 03:30 PM   Modules accepted: Orders

## 2015-03-30 DIAGNOSIS — H4011X1 Primary open-angle glaucoma, mild stage: Secondary | ICD-10-CM | POA: Diagnosis not present

## 2015-06-21 ENCOUNTER — Ambulatory Visit (INDEPENDENT_AMBULATORY_CARE_PROVIDER_SITE_OTHER): Payer: Medicare Other | Admitting: Obstetrics & Gynecology

## 2015-06-21 ENCOUNTER — Encounter: Payer: Self-pay | Admitting: Obstetrics & Gynecology

## 2015-06-21 VITALS — BP 114/76 | HR 67 | Ht 68.0 in | Wt 194.0 lb

## 2015-06-21 DIAGNOSIS — N898 Other specified noninflammatory disorders of vagina: Secondary | ICD-10-CM

## 2015-06-21 DIAGNOSIS — Z01419 Encounter for gynecological examination (general) (routine) without abnormal findings: Secondary | ICD-10-CM

## 2015-06-21 MED ORDER — ESTRADIOL 10 MCG VA TABS: ORAL_TABLET | VAGINAL | Status: DC

## 2015-06-21 NOTE — Progress Notes (Signed)
Subjective:    Susan Byrd is a 69 y.o. [place] female who presents for an annual exam. The patient has no complaints today. She needs a refill of vagifem and has a vaginal discharge and occasional itching. The patient is not currently sexually active. GYN screening history: last pap: was normal and last mammogram: was normal. The patient wears seatbelts: yes. The patient participates in regular exercise: yes. Has the patient ever been transfused or tattooed?: no. The patient reports that there is not domestic violence in her life.   Menstrual History: OB History    Gravida Para Term Preterm AB TAB SAB Ectopic Multiple Living   1 0              Menarche age: 40  No LMP recorded. Patient has had a hysterectomy.    The following portions of the patient's history were reviewed and updated as appropriate: allergies, current medications, past family history, past medical history, past social history, past surgical history and problem list.  Review of Systems A comprehensive review of systems was negative.    Objective:    BP 114/76 mmHg  Pulse 67  Ht 5\' 8"  (1.727 m)  Wt 194 lb (87.998 kg)  BMI 29.50 kg/m2  General Appearance:    Alert, cooperative, no distress, appears stated age  Head:    Normocephalic, without obvious abnormality, atraumatic  Eyes:    PERRL, conjunctiva/corneas clear, EOM's intact, fundi    benign, both eyes  Ears:    Normal TM's and external ear canals, both ears  Nose:   Nares normal, septum midline, mucosa normal, no drainage    or sinus tenderness  Throat:   Lips, mucosa, and tongue normal; teeth and gums normal  Neck:   Supple, symmetrical, trachea midline, no adenopathy;    thyroid:  no enlargement/tenderness/nodules; no carotid   bruit or JVD  Back:     Symmetric, no curvature, ROM normal, no CVA tenderness  Lungs:     Clear to auscultation bilaterally, respirations unlabored  Chest Wall:    No tenderness or deformity   Heart:    Regular rate and  rhythm, S1 and S2 normal, no murmur, rub   or gallop  Breast Exam:    No tenderness, masses, or nipple abnormality  Abdomen:     Soft, non-tender, bowel sounds active all four quadrants,    no masses, no organomegaly  Genitalia:    Normal female without lesion, discharge or tenderness     Extremities:   Extremities normal, atraumatic, no cyanosis or edema  Pulses:   2+ and symmetric all extremities  Skin:   Skin color, texture, turgor normal, no rashes or lesions  Lymph nodes:   Cervical, supraclavicular, and axillary nodes normal  Neurologic:   CNII-XII intact, normal strength, sensation and reflexes    throughout  .    Assessment:    Healthy female exam.    Plan:     Breast self exam technique reviewed and patient encouraged to perform self-exam monthly. Mammogram.

## 2015-06-21 NOTE — Progress Notes (Signed)
Here today for yearly gyn physical.  She does have a vaginal discharge with odor.  Has had continued vaginal dryness.

## 2015-06-22 LAB — WET PREP, GENITAL
Trich, Wet Prep: NONE SEEN
YEAST WET PREP: NONE SEEN

## 2015-07-13 ENCOUNTER — Other Ambulatory Visit: Payer: Self-pay | Admitting: Internal Medicine

## 2015-07-13 DIAGNOSIS — E785 Hyperlipidemia, unspecified: Secondary | ICD-10-CM

## 2015-07-13 DIAGNOSIS — E039 Hypothyroidism, unspecified: Secondary | ICD-10-CM

## 2015-07-13 DIAGNOSIS — Z1321 Encounter for screening for nutritional disorder: Secondary | ICD-10-CM

## 2015-07-19 ENCOUNTER — Other Ambulatory Visit (INDEPENDENT_AMBULATORY_CARE_PROVIDER_SITE_OTHER): Payer: Medicare Other

## 2015-07-19 DIAGNOSIS — E039 Hypothyroidism, unspecified: Secondary | ICD-10-CM

## 2015-07-19 DIAGNOSIS — E785 Hyperlipidemia, unspecified: Secondary | ICD-10-CM | POA: Diagnosis not present

## 2015-07-19 LAB — COMPREHENSIVE METABOLIC PANEL
ALBUMIN: 4.1 g/dL (ref 3.5–5.2)
ALK PHOS: 52 U/L (ref 39–117)
ALT: 21 U/L (ref 0–35)
AST: 17 U/L (ref 0–37)
BUN: 23 mg/dL (ref 6–23)
CALCIUM: 9.7 mg/dL (ref 8.4–10.5)
CHLORIDE: 102 meq/L (ref 96–112)
CO2: 30 mEq/L (ref 19–32)
Creatinine, Ser: 0.94 mg/dL (ref 0.40–1.20)
GFR: 62.69 mL/min (ref >60.00)
Glucose, Bld: 112 mg/dL — ABNORMAL HIGH (ref 70–99)
POTASSIUM: 4.2 meq/L (ref 3.5–5.1)
Sodium: 138 mEq/L (ref 135–145)
Total Bilirubin: 0.7 mg/dL (ref 0.2–1.2)
Total Protein: 7.3 g/dL (ref 6.0–8.3)

## 2015-07-19 LAB — CBC
HEMATOCRIT: 44 % (ref 36.0–46.0)
HEMOGLOBIN: 14.6 g/dL (ref 12.0–15.0)
MCHC: 33.2 g/dL (ref 30.0–36.0)
MCV: 89.9 fl (ref 78.0–100.0)
PLATELETS: 209 10*3/uL (ref 150.0–400.0)
RBC: 4.9 Mil/uL (ref 3.87–5.11)
RDW: 14.1 % (ref 11.5–15.5)
WBC: 6.8 10*3/uL (ref 4.0–10.5)

## 2015-07-19 LAB — LIPID PANEL
Cholesterol: 159 mg/dL (ref 0–200)
HDL: 39.7 mg/dL (ref >39.00)
LDL Cholesterol: 83 mg/dL (ref 0–99)
NONHDL: 119.19
TRIGLYCERIDES: 182 mg/dL — AB (ref 0.0–149.0)
Total CHOL/HDL Ratio: 4
VLDL: 36.4 mg/dL (ref 0.0–40.0)

## 2015-07-19 LAB — TSH: TSH: 2.3 u[IU]/mL (ref 0.35–4.50)

## 2015-07-19 LAB — T4, FREE: FREE T4: 0.94 ng/dL (ref 0.60–1.60)

## 2015-07-20 DIAGNOSIS — H532 Diplopia: Secondary | ICD-10-CM | POA: Diagnosis not present

## 2015-07-26 ENCOUNTER — Ambulatory Visit (INDEPENDENT_AMBULATORY_CARE_PROVIDER_SITE_OTHER): Payer: Medicare Other | Admitting: Family Medicine

## 2015-07-26 ENCOUNTER — Encounter: Payer: Self-pay | Admitting: Family Medicine

## 2015-07-26 VITALS — BP 126/70 | HR 81 | Temp 98.2°F | Ht 68.0 in | Wt 193.8 lb

## 2015-07-26 DIAGNOSIS — Z23 Encounter for immunization: Secondary | ICD-10-CM | POA: Diagnosis not present

## 2015-07-26 DIAGNOSIS — E785 Hyperlipidemia, unspecified: Secondary | ICD-10-CM

## 2015-07-26 DIAGNOSIS — E0580 Other thyrotoxicosis without thyrotoxic crisis or storm: Secondary | ICD-10-CM

## 2015-07-26 DIAGNOSIS — E038 Other specified hypothyroidism: Secondary | ICD-10-CM

## 2015-07-26 DIAGNOSIS — E039 Hypothyroidism, unspecified: Secondary | ICD-10-CM | POA: Insufficient documentation

## 2015-07-26 DIAGNOSIS — Z1239 Encounter for other screening for malignant neoplasm of breast: Secondary | ICD-10-CM

## 2015-07-26 DIAGNOSIS — Z0000 Encounter for general adult medical examination without abnormal findings: Secondary | ICD-10-CM | POA: Diagnosis not present

## 2015-07-26 DIAGNOSIS — I10 Essential (primary) hypertension: Secondary | ICD-10-CM

## 2015-07-26 MED ORDER — SPIRONOLACTONE-HCTZ 25-25 MG PO TABS: 1.0000 | ORAL_TABLET | Freq: Every day | ORAL | Status: DC

## 2015-07-26 MED ORDER — EZETIMIBE-SIMVASTATIN 10-20 MG PO TABS: 1.0000 | ORAL_TABLET | Freq: Every day | ORAL | Status: DC

## 2015-07-26 NOTE — Assessment & Plan Note (Signed)
The patients weight, height, BMI and visual acuity have been recorded in the chart.  Cognitive function assessed.   I have made referrals, counseling and provided education to the patient based review of the above and I have provided the pt with a written personalized care plan for preventive services.  Influenza vaccine given today.

## 2015-07-26 NOTE — Addendum Note (Signed)
Addended by: Lucille Passy on: 07/26/2015 12:48 PM   Modules accepted: Orders, SmartSet

## 2015-07-26 NOTE — Assessment & Plan Note (Signed)
Euthyroid on current rxs. No changes made today. 

## 2015-07-26 NOTE — Assessment & Plan Note (Signed)
Well controlled. No changes made today. 

## 2015-07-26 NOTE — Progress Notes (Signed)
Pre visit review using our clinic review tool, if applicable. No additional management support is needed unless otherwise documented below in the visit note. 

## 2015-07-26 NOTE — Assessment & Plan Note (Signed)
Well controlled on current rxs. No changes made. 

## 2015-07-26 NOTE — Progress Notes (Signed)
Subjective:   Patient ID: Susan Byrd, female    DOB: [birth year], 69 y.o.   MRN: [id number]  Susan Byrd is a pleasant 69 y.o. year old female who presents to clinic today with Annual Exam  and follow up of chronic medical conditions on 07/26/2015  HPI: I have personally reviewed the Medicare Annual Wellness questionnaire and have noted 1. The patient's medical and social history 2. Their use of alcohol, tobacco or illicit drugs 3. Their current medications and supplements 4. The patient's functional ability including ADL's, fall risks, home safety risks and hearing or visual             impairment. 5. Diet and physical activities 6. Evidence for depression or mood disorders  Pneumovax 02/26/12 Prevnar 13 07/07/14 Td 02/26/12 Zoster 09/14/10 Saw Dr. Hulan Fray on 05/22/15- S/p hysterectomy colonoscopy 10/24/10 Eye exam- 03/2015- Dr. Dawna Part  End of life wishes discussed and updated in Social History.  The roster of all physicians providing medical care to patient - is listed in the Snapshot section of the chart.   Hypothyroidism- on Synthroid 100 micrograms daily.  Denies any symptoms of hypo or hyperthyroidism. Wt Readings from Last 3 Encounters:  07/26/15 193 lb 12 oz (87.884 kg)  06/21/15 194 lb (87.998 kg)  07/07/14 187 lb 4 oz (84.936 kg)     Lab Results  Component Value Date   TSH 2.30 07/19/2015    HLD- has been on Vytorin 10-40 mg.    Has cut back on fatty foods. Denies myalgias.  Lab Results  Component Value Date   CHOL 159 07/19/2015   HDL 39.70 07/19/2015   LDLCALC 83 07/19/2015   TRIG 182.0* 07/19/2015   CHOLHDL 4 07/19/2015   Lab Results  Component Value Date   ALT 21 07/19/2015   AST 17 07/19/2015   ALKPHOS 52 07/19/2015   BILITOT 0.7 07/19/2015      HTN- has been on Spironolactone/HCTZ 25/25 daily.  No CP, SOB, LE edema or blurred vision.  Lab Results  Component Value Date   CREATININE 0.94 07/19/2015    Lab Results  Component  Value Date   WBC 6.8 07/19/2015   HGB 14.6 07/19/2015   HCT 44.0 07/19/2015   MCV 89.9 07/19/2015   PLT 209.0 07/19/2015     Current Outpatient Prescriptions on File Prior to Visit  Medication Sig Dispense Refill  . clobetasol (TEMOVATE) 0.05 % cream Apply to vulva 2-3 times per week 30 g 6  . Estradiol 10 MCG TABS vaginal tablet One tablet three times per week 8 tablet 16  . ezetimibe-simvastatin (VYTORIN) 10-20 MG per tablet Take 1 tablet by mouth daily. 90 tablet 3  . latanoprost (XALATAN) 0.005 % ophthalmic solution Place 1 drop into both eyes daily.      Marland Kitchen levothyroxine (SYNTHROID, LEVOTHROID) 100 MCG tablet TAKE 1 TABLET BY MOUTH DAILY. 90 tablet 2   No current facility-administered medications on file prior to visit.    Allergies  Allergen Reactions  . Amoxicillin     REACTION: rash  . Penicillins     REACTION: rash  . Sulfonamide Derivatives     REACTION: rash    Past Medical History  Diagnosis Date  . Hyperlipidemia   . Hypertension   . Thyroid disease   . Migraine   . High cholesterol   . Interdigital neuroma 07/29/2013    3RD IDS LEFT FOOT    Past Surgical History  Procedure Laterality Date  . Cholecystectomy    .  Abdominal hysterectomy    . Bunionectomy      Family History  Problem Relation Age of Onset  . Cancer Mother     bone  . Stroke Father   . Heart disease Brother   . Hypertension Brother   . Heart disease Sister   . Hypertension Sister     Social History   Social History  . Marital Status: Married    Spouse Name: N/A  . Number of Children: N/A  . Years of Education: N/A   Occupational History  . Retired    Social History Main Topics  . Smoking status: Never Smoker   . Smokeless tobacco: Never Used  . Alcohol Use: Yes     Comment: 1-2 PER WEEK  . Drug Use: No  . Sexual Activity: Not on file     Comment: Husband decease   Other Topics Concern  . Not on file   Social History Narrative   Widowed 2014   Does have a  living will.   Desires CPR- would not want prolonged life support if futile.   The PMH, PSH, Social History, Family History, Medications, and allergies have been reviewed in [facility], and have been updated if relevant.    Review of Systems     Wt Readings from Last 3 Encounters:  07/26/15 193 lb 12 oz (87.884 kg)  06/21/15 194 lb (87.998 kg)  07/07/14 187 lb 4 oz (84.936 kg)     Objective:    BP 126/70 mmHg  Pulse 81  Temp(Src) 98.2 F (36.8 C) (Oral)  Ht 5\' 8"  (1.727 m)  Wt 193 lb 12 oz (87.884 kg)  BMI 29.47 kg/m2  SpO2 94%   Physical Exam  Constitutional: She is oriented to person, place, and time. She appears well-developed and well-nourished. No distress.  HENT:  Head: Normocephalic and atraumatic.  Eyes: Pupils are equal, round, and reactive to light.  Neck: Normal range of motion. Neck supple. No thyromegaly present.  Pulmonary/Chest: Effort normal and breath sounds normal.  Abdominal: Soft. Bowel sounds are normal. She exhibits no distension.  Musculoskeletal: Normal range of motion.  Neurological: She is alert and oriented to person, place, and time. No cranial nerve deficit. Coordination normal.  No tremor  Skin: Skin is warm and dry.  Psychiatric: She has a normal mood and affect. Her behavior is normal. Judgment and thought content normal.  Nursing note and vitals reviewed.         Assessment & Plan:   Medicare annual wellness visit, subsequent  HLD (hyperlipidemia)  Essential hypertension  Other thyrotoxicosis without thyrotoxic crisis or storm No Follow-up on file.

## 2015-07-27 DIAGNOSIS — H5032 Intermittent alternating esotropia: Secondary | ICD-10-CM | POA: Diagnosis not present

## 2015-08-05 ENCOUNTER — Other Ambulatory Visit: Payer: Self-pay | Admitting: Family Medicine

## 2015-08-05 DIAGNOSIS — Z1231 Encounter for screening mammogram for malignant neoplasm of breast: Secondary | ICD-10-CM

## 2015-09-13 ENCOUNTER — Ambulatory Visit
Admission: RE | Admit: 2015-09-13 | Discharge: 2015-09-13 | Disposition: A | Discharge status: Home / Self Care | Payer: Medicare Other | Source: Ambulatory Visit | Attending: Family Medicine | Admitting: Family Medicine

## 2015-09-13 DIAGNOSIS — Z1231 Encounter for screening mammogram for malignant neoplasm of breast: Secondary | ICD-10-CM

## 2015-09-21 DIAGNOSIS — H2513 Age-related nuclear cataract, bilateral: Secondary | ICD-10-CM | POA: Diagnosis not present

## 2015-10-31 ENCOUNTER — Encounter: Payer: Self-pay | Admitting: Family Medicine

## 2015-10-31 ENCOUNTER — Ambulatory Visit (INDEPENDENT_AMBULATORY_CARE_PROVIDER_SITE_OTHER): Payer: Medicare Other | Admitting: Family Medicine

## 2015-10-31 VITALS — BP 106/70 | HR 71 | Temp 97.4°F | Wt 199.2 lb

## 2015-10-31 DIAGNOSIS — K118 Other diseases of salivary glands: Secondary | ICD-10-CM | POA: Insufficient documentation

## 2015-10-31 DIAGNOSIS — R6884 Jaw pain: Secondary | ICD-10-CM | POA: Diagnosis not present

## 2015-10-31 DIAGNOSIS — R229 Localized swelling, mass and lump, unspecified: Secondary | ICD-10-CM

## 2015-10-31 LAB — COMPREHENSIVE METABOLIC PANEL
ALBUMIN: 3.9 g/dL (ref 3.5–5.2)
ALK PHOS: 54 U/L (ref 39–117)
ALT: 24 U/L (ref 0–35)
AST: 19 U/L (ref 0–37)
BUN: 17 mg/dL (ref 6–23)
CHLORIDE: 101 meq/L (ref 96–112)
CO2: 31 mEq/L (ref 19–32)
Calcium: 9.6 mg/dL (ref 8.4–10.5)
Creatinine, Ser: 0.87 mg/dL (ref 0.40–1.20)
GFR: 68.49 mL/min (ref >60.00)
Glucose, Bld: 109 mg/dL — ABNORMAL HIGH (ref 70–99)
POTASSIUM: 3.9 meq/L (ref 3.5–5.1)
SODIUM: 140 meq/L (ref 135–145)
TOTAL PROTEIN: 6.9 g/dL (ref 6.0–8.3)
Total Bilirubin: 0.5 mg/dL (ref 0.2–1.2)

## 2015-10-31 NOTE — Progress Notes (Signed)
Subjective:   Patient ID: Susan Byrd, female    DOB: 1946-06-06, 69 y.o.   MRN: [id number]  Susan Byrd is a pleasant 69 y.o. year old female who presents to clinic today with Facial Pain  on 10/31/2015  HPI: Since having a right upper molar pulled 3 months ago, she has had left lower jaw/neck pain. Went to dentist- per pt, xrays unremarkable. They replaced a cap which has not helped.  Now she also notices a bump behind her right ear.  Feels constant right neck/jaw pressure, especially when she presses under her jaw.  No pain with eating but there is pressure.  Nothing seems to make it better or worse.  Current Outpatient Prescriptions on File Prior to Visit  Medication Sig Dispense Refill  . clobetasol (TEMOVATE) 0.05 % cream Apply to vulva 2-3 times per week 30 g 6  . Estradiol 10 MCG TABS vaginal tablet One tablet three times per week 8 tablet 16  . ezetimibe-simvastatin (VYTORIN) 10-20 MG per tablet Take 1 tablet by mouth daily. 90 tablet 3  . latanoprost (XALATAN) 0.005 % ophthalmic solution Place 1 drop into both eyes daily.      Marland Kitchen levothyroxine (SYNTHROID, LEVOTHROID) 100 MCG tablet TAKE 1 TABLET BY MOUTH DAILY. 90 tablet 2  . spironolactone-hydrochlorothiazide (ALDACTAZIDE) 25-25 MG per tablet Take 1 tablet by mouth daily. 90 tablet 2   No current facility-administered medications on file prior to visit.    Allergies  Allergen Reactions  . Amoxicillin     REACTION: rash  . Penicillins     REACTION: rash  . Sulfonamide Derivatives     REACTION: rash    Past Medical History  Diagnosis Date  . Hyperlipidemia   . Hypertension   . Thyroid disease   . Migraine   . High cholesterol   . Interdigital neuroma 07/29/2013    3RD IDS LEFT FOOT    Past Surgical History  Procedure Laterality Date  . Cholecystectomy    . Abdominal hysterectomy    . Bunionectomy      Family History  Problem Relation Age of Onset  . Cancer Mother     bone  . Stroke  Father   . Heart disease Brother   . Hypertension Brother   . Heart disease Sister   . Hypertension Sister     Social History   Social History  . Marital Status: Married    Spouse Name: N/A  . Number of Children: N/A  . Years of Education: N/A   Occupational History  . Retired    Social History Main Topics  . Smoking status: Never Smoker   . Smokeless tobacco: Never Used  . Alcohol Use: Yes     Comment: 1-2 PER WEEK  . Drug Use: No  . Sexual Activity: Not on file     Comment: Husband decease   Other Topics Concern  . Not on file   Social History Narrative   Widowed 2014   Does have a living will.   Desires CPR- would not want prolonged life support if futile.   The PMH, PSH, Social History, Family History, Medications, and allergies have been reviewed in [facility], and have been updated if relevant.   Review of Systems  Constitutional: Negative.   HENT: Positive for facial swelling. Negative for congestion, dental problem, drooling, ear discharge, hearing loss, mouth sores, sneezing, sore throat, tinnitus, trouble swallowing and voice change.   Musculoskeletal: Positive for neck pain. Negative for neck stiffness.  All other systems reviewed and are negative.      Objective:    BP 106/70 mmHg  Pulse 71  Temp(Src) 97.4 F (36.3 C) (Oral)  Wt 199 lb 4 oz (90.379 kg)  SpO2 99%   Physical Exam  Constitutional: She is oriented to person, place, and time. She appears well-developed and well-nourished. No distress.  HENT:  Head:    Eyes: Conjunctivae are normal.  Cardiovascular: Normal rate.   Pulmonary/Chest: Effort normal.  Lymphadenopathy:    She has cervical adenopathy.  Neurological: She is alert and oriented to person, place, and time. No cranial nerve deficit.  Skin: Skin is warm and dry.  Psychiatric: She has a normal mood and affect. Her behavior is normal. Judgment and thought content normal.  Nursing note and vitals reviewed.           Assessment & Plan:   No diagnosis found. No Follow-up on file.

## 2015-10-31 NOTE — Patient Instructions (Signed)
Great to see you.  Please stop by to see Susan Byrd on your way out. 

## 2015-10-31 NOTE — Progress Notes (Signed)
Pre visit review using our clinic review tool, if applicable. No additional management support is needed unless otherwise documented below in the visit note. 

## 2015-10-31 NOTE — Assessment & Plan Note (Signed)
With shotty lymphadenopathy and mass behind right ear. ? Nerve injury during dental procedure but now does have some adenopathy and post auricular mass. Per pt, dental xrays neg. Will proceed with CT of soft tissue of neck for further evaluation. The patient indicates understanding of these issues and agrees with the plan.

## 2015-11-04 ENCOUNTER — Ambulatory Visit
Admission: RE | Admit: 2015-11-04 | Discharge: 2015-11-04 | Disposition: A | Discharge status: Home / Self Care | Payer: Medicare Other | Source: Ambulatory Visit | Attending: Family Medicine | Admitting: Family Medicine

## 2015-11-04 DIAGNOSIS — K118 Other diseases of salivary glands: Secondary | ICD-10-CM

## 2015-11-04 DIAGNOSIS — R6884 Jaw pain: Secondary | ICD-10-CM | POA: Diagnosis not present

## 2015-11-04 DIAGNOSIS — E034 Atrophy of thyroid (acquired): Secondary | ICD-10-CM | POA: Diagnosis not present

## 2015-11-04 DIAGNOSIS — R51 Headache: Secondary | ICD-10-CM | POA: Diagnosis not present

## 2015-11-04 DIAGNOSIS — R229 Localized swelling, mass and lump, unspecified: Secondary | ICD-10-CM

## 2015-11-04 MED ORDER — IOHEXOL 350 MG/ML SOLN
75.0000 mL | Freq: Once | INTRAVENOUS | Status: AC | PRN
  Administered 2015-11-04: 75 mL via INTRAVENOUS

## 2015-11-10 ENCOUNTER — Telehealth: Payer: Self-pay | Admitting: Family Medicine

## 2015-11-10 DIAGNOSIS — K118 Other diseases of salivary glands: Secondary | ICD-10-CM

## 2015-11-10 DIAGNOSIS — R6884 Jaw pain: Principal | ICD-10-CM

## 2015-11-10 NOTE — Telephone Encounter (Signed)
I'm glad she called.  I'm not sure why I did not see these results.  Everything looked ok.  No acute findings.  I would suggest an ENT referral would be the next step.

## 2015-11-10 NOTE — Telephone Encounter (Signed)
Referral placed.

## 2015-11-10 NOTE — Telephone Encounter (Signed)
Spoke to pt and informed her of results. Pt is agreeable to referral and was advised to await a call with appt details

## 2015-11-10 NOTE — Telephone Encounter (Signed)
Pt called wanting to speak with dr Deborra Medina about ct results and what the next step would be

## 2015-11-21 ENCOUNTER — Other Ambulatory Visit: Payer: Self-pay | Admitting: *Deleted

## 2015-11-21 MED ORDER — LEVOTHYROXINE SODIUM 100 MCG PO TABS: ORAL_TABLET | ORAL | Status: DC

## 2015-11-23 ENCOUNTER — Other Ambulatory Visit: Payer: Self-pay | Admitting: Otolaryngology

## 2015-11-23 DIAGNOSIS — I6521 Occlusion and stenosis of right carotid artery: Secondary | ICD-10-CM

## 2015-11-23 DIAGNOSIS — R1312 Dysphagia, oropharyngeal phase: Secondary | ICD-10-CM | POA: Diagnosis not present

## 2015-11-23 DIAGNOSIS — K219 Gastro-esophageal reflux disease without esophagitis: Secondary | ICD-10-CM | POA: Diagnosis not present

## 2015-11-30 ENCOUNTER — Ambulatory Visit
Admission: RE | Admit: 2015-11-30 | Discharge: 2015-11-30 | Disposition: A | Discharge status: Home / Self Care | Payer: Medicare Other | Source: Ambulatory Visit | Attending: Otolaryngology | Admitting: Otolaryngology

## 2015-11-30 DIAGNOSIS — I6523 Occlusion and stenosis of bilateral carotid arteries: Secondary | ICD-10-CM | POA: Diagnosis not present

## 2015-11-30 DIAGNOSIS — I6521 Occlusion and stenosis of right carotid artery: Secondary | ICD-10-CM

## 2015-12-02 ENCOUNTER — Other Ambulatory Visit: Payer: Self-pay | Admitting: Otolaryngology

## 2015-12-02 DIAGNOSIS — R220 Localized swelling, mass and lump, head: Secondary | ICD-10-CM

## 2015-12-02 DIAGNOSIS — R221 Localized swelling, mass and lump, neck: Principal | ICD-10-CM

## 2015-12-02 DIAGNOSIS — R1310 Dysphagia, unspecified: Secondary | ICD-10-CM

## 2015-12-07 DIAGNOSIS — H2513 Age-related nuclear cataract, bilateral: Secondary | ICD-10-CM | POA: Diagnosis not present

## 2015-12-09 ENCOUNTER — Ambulatory Visit
Admission: RE | Admit: 2015-12-09 | Discharge: 2015-12-09 | Disposition: A | Discharge status: Home / Self Care | Payer: Medicare Other | Source: Ambulatory Visit | Attending: Otolaryngology | Admitting: Otolaryngology

## 2015-12-09 DIAGNOSIS — R1310 Dysphagia, unspecified: Secondary | ICD-10-CM | POA: Insufficient documentation

## 2015-12-09 NOTE — Therapy (Signed)
[place], [place], [address] Phone: [phone]   Fax:     Modified Barium Swallow  Patient Details  Name: Susan Byrd MRN: [id number] Date of Birth: [birth year] No Data Recorded  Encounter Date: 12/09/2015      End of Session - 12/09/15 1456    Visit Number 1   Number of Visits 1   Date for SLP Re-Evaluation 12/09/15   SLP Start Time 1300   SLP Stop Time  1344   SLP Time Calculation (min) 44 min   Activity Tolerance Patient tolerated treatment well      Past Medical History  Diagnosis Date  . Hyperlipidemia   . Hypertension   . Thyroid disease   . Migraine   . High cholesterol   . Interdigital neuroma 07/29/2013    3RD IDS LEFT FOOT    Past Surgical History  Procedure Laterality Date  . Cholecystectomy    . Abdominal hysterectomy    . Bunionectomy      There were no vitals filed for this visit.  Visit Diagnosis: Dysphagia - Plan: DG OP Swallowing Func-Medicare/Speech Path, DG OP Swallowing Func-Medicare/Speech Path     Subjective: Patient behavior: (alertness, ability to follow instructions, etc.): Independent for communication and cognition.  Chief complaint: submandibular swelling and globus sensation on right with occasional choking    Objective:  Radiological Procedure: A videoflouroscopic evaluation of oral-preparatory, reflex initiation, and pharyngeal phases of the swallow was performed; as well as a screening of the upper esophageal phase.  I. POSTURE: Upright in MBS chair  II. VIEW: Lateral and A-P  III. COMPENSATORY STRATEGIES: N/A  IV. BOLUSES ADMINISTERED:   Thin Liquid: 2 small sips, 3 rapid consecutive sips   Nectar-thick Liquid: 1 cup rim sip    Puree: 3 teaspoons boluses   Mechanical Soft: 1/4 graham cracker in applesauce  V. RESULTS OF EVALUATION: A. ORAL PREPARATORY PHASE: (The lips, tongue, and velum are observed for strength and  coordination) Within normal limits       **Overall Severity Rating: WNL  B. SWALLOW INITIATION/REFLEX: (The reflex is normal if "triggered" by the time the bolus reached the base of the tongue) Within normal limits  **Overall Severity Rating: WNL  C. PHARYNGEAL PHASE: (Pharyngeal function is normal if the bolus shows rapid, smooth, and continuous transit through the pharynx and there is no pharyngeal residue after the swallow) Within normal limits  **Overall Severity Rating: WNL  D. LARYNGEAL PENETRATION: (Material entering into the laryngeal inlet/vestibule but not aspirated) None  E. ASPIRATION: None  F. ESOPHAGEAL PHASE: (Screening of the upper esophagus) No observed abnormalities in the viewable cervical esophagus  ASSESSMENT: 70 year old woman, with submandibular swelling and globus sensation on the right with occasional choking, is presenting with functional oropharyngeal swallowing.  Oral control of the bolus including oral hold, rotary mastication, and anterior to posterior transfer is within normal limits.  Timing of the pharyngeal swallow is within normal limits.  Pharyngeal aspects of the swallow including hyolaryngeal excursion, pharyngeal pressure generation, epiglottic inversion, duration/amplitude of UES opening, and laryngeal vestibule closure at the height of the swallow are within normal limits.  There is no observed pharyngeal residue, laryngeal penetration, or tracheal aspiration.  A-P view shows symmetrical passage though the pharynx into the esophagus.  PLAN/RECOMMENDATIONS:   A. Diet: Regular   B. Swallowing Precautions: N/A   C. Recommended consultation to: follow up with MD as recommended   D. Therapy recommendations  N/A   E. Results and recommendations were discussed with the patient immediately following the study and the final report will be routed to the referring MD.         G-Codes - 12-14-15 1455    Functional Assessment Tool Used MBS, clincial  judgment   Functional Limitations Swallowing   Swallow Current Status [id number]) At least 1 percent but less than 20 percent impaired, limited or restricted   Swallow Goal Status [id number]) At least 1 percent but less than 20 percent impaired, limited or restricted   Swallow Discharge Status [id number]) At least 1 percent but less than 20 percent impaired, limited or restricted          Problem List Patient Active Problem List   Diagnosis Date Noted  . Skin mass 10/31/2015  . Pain of submandibular gland 10/31/2015  . Hypothyroidism 07/26/2015  . Medicare annual wellness visit, subsequent 07/07/2014  . Neuropathy of left foot 04/19/2014  . Interdigital neuroma   . Thyrotoxicosis 05/11/2010  . HLD (hyperlipidemia) 05/11/2010  . MIGRAINE HEADACHE 05/11/2010  . Essential hypertension 05/11/2010  . VAGINITIS, ATROPHIC 05/11/2010   Leroy Sea, MS/CCC- SLP  Lou Miner 14-Dec-2015, 2:57 PM  [facility] [place], [place], [address] Phone: [phone]   Fax:     Name: Susan Byrd MRN: [id number] Date of Birth: August 25, 1946

## 2015-12-13 DIAGNOSIS — H2513 Age-related nuclear cataract, bilateral: Secondary | ICD-10-CM | POA: Diagnosis not present

## 2015-12-14 ENCOUNTER — Encounter: Payer: Self-pay | Admitting: *Deleted

## 2015-12-16 NOTE — Discharge Instructions (Signed)

## 2015-12-19 ENCOUNTER — Ambulatory Visit: Payer: Medicare Other | Admitting: Anesthesiology

## 2015-12-19 ENCOUNTER — Ambulatory Visit
Admission: RE | Admit: 2015-12-19 | Discharge: 2015-12-19 | Disposition: A | Discharge status: Home / Self Care | Payer: Medicare Other | Source: Ambulatory Visit | Attending: Ophthalmology | Admitting: Ophthalmology

## 2015-12-19 ENCOUNTER — Encounter
Admission: RE | Disposition: A | Discharge status: Home / Self Care | Payer: Self-pay | Source: Ambulatory Visit | Attending: Ophthalmology

## 2015-12-19 DIAGNOSIS — I10 Essential (primary) hypertension: Secondary | ICD-10-CM | POA: Diagnosis not present

## 2015-12-19 DIAGNOSIS — R51 Headache: Secondary | ICD-10-CM | POA: Insufficient documentation

## 2015-12-19 DIAGNOSIS — E7800 Pure hypercholesterolemia, unspecified: Secondary | ICD-10-CM | POA: Diagnosis not present

## 2015-12-19 DIAGNOSIS — G629 Polyneuropathy, unspecified: Secondary | ICD-10-CM | POA: Diagnosis not present

## 2015-12-19 DIAGNOSIS — H2512 Age-related nuclear cataract, left eye: Secondary | ICD-10-CM | POA: Diagnosis not present

## 2015-12-19 DIAGNOSIS — Z90710 Acquired absence of both cervix and uterus: Secondary | ICD-10-CM | POA: Insufficient documentation

## 2015-12-19 DIAGNOSIS — Z880 Allergy status to penicillin: Secondary | ICD-10-CM | POA: Diagnosis not present

## 2015-12-19 DIAGNOSIS — E079 Disorder of thyroid, unspecified: Secondary | ICD-10-CM | POA: Diagnosis not present

## 2015-12-19 DIAGNOSIS — Z79899 Other long term (current) drug therapy: Secondary | ICD-10-CM | POA: Insufficient documentation

## 2015-12-19 DIAGNOSIS — E039 Hypothyroidism, unspecified: Secondary | ICD-10-CM | POA: Insufficient documentation

## 2015-12-19 DIAGNOSIS — Z882 Allergy status to sulfonamides status: Secondary | ICD-10-CM | POA: Diagnosis not present

## 2015-12-19 DIAGNOSIS — H2513 Age-related nuclear cataract, bilateral: Secondary | ICD-10-CM | POA: Diagnosis not present

## 2015-12-19 DIAGNOSIS — Z9049 Acquired absence of other specified parts of digestive tract: Secondary | ICD-10-CM | POA: Diagnosis not present

## 2015-12-19 DIAGNOSIS — H409 Unspecified glaucoma: Secondary | ICD-10-CM | POA: Diagnosis not present

## 2015-12-19 DIAGNOSIS — K219 Gastro-esophageal reflux disease without esophagitis: Secondary | ICD-10-CM | POA: Diagnosis not present

## 2015-12-19 DIAGNOSIS — G43909 Migraine, unspecified, not intractable, without status migrainosus: Secondary | ICD-10-CM | POA: Insufficient documentation

## 2015-12-19 DIAGNOSIS — Z881 Allergy status to other antibiotic agents status: Secondary | ICD-10-CM | POA: Diagnosis not present

## 2015-12-19 HISTORY — PX: CATARACT EXTRACTION W/PHACO: SHX586

## 2015-12-19 HISTORY — DX: Motion sickness, initial encounter: T75.3XXA

## 2015-12-19 HISTORY — DX: Gastro-esophageal reflux disease without esophagitis: K21.9

## 2015-12-19 SURGERY — PHACOEMULSIFICATION, CATARACT, WITH IOL INSERTION
Anesthesia: Monitor Anesthesia Care | Site: Eye | Laterality: Left | Wound class: Clean

## 2015-12-19 MED ORDER — MIDAZOLAM HCL 2 MG/2ML IJ SOLN
INTRAMUSCULAR | Status: DC | PRN
  Administered 2015-12-19: 2 mg via INTRAVENOUS

## 2015-12-19 MED ORDER — LIDOCAINE HCL (PF) 4 % IJ SOLN
INTRAMUSCULAR | Status: DC | PRN
  Administered 2015-12-19: 1 mL via OPHTHALMIC

## 2015-12-19 MED ORDER — TETRACAINE HCL 0.5 % OP SOLN
1.0000 [drp] | OPHTHALMIC | Status: DC | PRN
  Administered 2015-12-19: 1 [drp] via OPHTHALMIC

## 2015-12-19 MED ORDER — ARMC OPHTHALMIC DILATING GEL
1.0000 "application " | OPHTHALMIC | Status: DC | PRN
  Administered 2015-12-19 (×2): 1 via OPHTHALMIC

## 2015-12-19 MED ORDER — NA HYALUR & NA CHOND-NA HYALUR 0.4-0.35 ML IO KIT
PACK | INTRAOCULAR | Status: DC | PRN
  Administered 2015-12-19: 1 mL via INTRAOCULAR

## 2015-12-19 MED ORDER — MOXIFLOXACIN HCL 0.5 % OP SOLN
OPHTHALMIC | Status: DC | PRN
  Administered 2015-12-19: 1 [drp] via OPHTHALMIC

## 2015-12-19 MED ORDER — OXYCODONE HCL 5 MG PO TABS: 5.0000 mg | ORAL_TABLET | Freq: Once | ORAL | Status: DC | PRN

## 2015-12-19 MED ORDER — POVIDONE-IODINE 5 % OP SOLN
1.0000 "application " | OPHTHALMIC | Status: DC | PRN
  Administered 2015-12-19: 1 via OPHTHALMIC

## 2015-12-19 MED ORDER — EPINEPHRINE HCL 1 MG/ML IJ SOLN
INTRAOCULAR | Status: DC | PRN
  Administered 2015-12-19: 100 mL via OPHTHALMIC

## 2015-12-19 MED ORDER — LACTATED RINGERS IV SOLN: INTRAVENOUS | Status: DC

## 2015-12-19 MED ORDER — OXYCODONE HCL 5 MG/5ML PO SOLN: 5.0000 mg | Freq: Once | ORAL | Status: DC | PRN

## 2015-12-19 MED ORDER — BRIMONIDINE TARTRATE 0.2 % OP SOLN
OPHTHALMIC | Status: DC | PRN
  Administered 2015-12-19: 1 [drp] via OPHTHALMIC

## 2015-12-19 MED ORDER — TIMOLOL MALEATE 0.5 % OP SOLN
OPHTHALMIC | Status: DC | PRN
  Administered 2015-12-19: 1 [drp] via OPHTHALMIC

## 2015-12-19 MED ORDER — FENTANYL CITRATE (PF) 100 MCG/2ML IJ SOLN
INTRAMUSCULAR | Status: DC | PRN
  Administered 2015-12-19: 50 ug via INTRAVENOUS

## 2015-12-19 SURGICAL SUPPLY — 29 items
APPLICATOR COTTON TIP 3IN (MISCELLANEOUS) ×3 IMPLANT
CANNULA ANT/CHMB 27GA (MISCELLANEOUS) ×3 IMPLANT
DISSECTOR HYDRO NUCLEUS 50X22 (MISCELLANEOUS) ×3 IMPLANT
GLOVE BIO SURGEON STRL SZ7 (GLOVE) ×3 IMPLANT
GLOVE SURG LX 6.5 MICRO (GLOVE) ×4
GLOVE SURG LX STRL 6.5 MICRO (GLOVE) ×2 IMPLANT
GOWN STRL REUS W/ TWL LRG LVL3 (GOWN DISPOSABLE) ×2 IMPLANT
GOWN STRL REUS W/TWL LRG LVL3 (GOWN DISPOSABLE) ×4
LENS IOL ACRSF IQ PC 7.5 (Intraocular Lens) ×1 IMPLANT
LENS IOL ACRYSOF IQ POST 7.5 (Intraocular Lens) ×3 IMPLANT
MARKER SKIN SURG W/RULER VIO (MISCELLANEOUS) ×3 IMPLANT
NEEDLE FILTER BLUNT 18X 1/2SAF (NEEDLE) ×2
NEEDLE FILTER BLUNT 18X1 1/2 (NEEDLE) ×1 IMPLANT
PACK CATARACT BRASINGTON (MISCELLANEOUS) ×3 IMPLANT
PACK EYE AFTER SURG (MISCELLANEOUS) ×3 IMPLANT
PACK OPTHALMIC (MISCELLANEOUS) ×3 IMPLANT
RING MALYGIN 7.0 (MISCELLANEOUS) IMPLANT
SOL BAL SALT 15ML (MISCELLANEOUS)
SOLUTION BAL SALT 15ML (MISCELLANEOUS) IMPLANT
SUT ETHILON 10-0 CS-B-6CS-B-6 (SUTURE)
SUT VICRYL  9 0 (SUTURE)
SUT VICRYL 9 0 (SUTURE) IMPLANT
SUTURE EHLN 10-0 CS-B-6CS-B-6 (SUTURE) IMPLANT
SYR 3ML LL SCALE MARK (SYRINGE) ×6 IMPLANT
SYR TB 1ML LUER SLIP (SYRINGE) ×3 IMPLANT
WATER STERILE IRR 250ML POUR (IV SOLUTION) ×3 IMPLANT
WATER STERILE IRR 500ML POUR (IV SOLUTION) ×3 IMPLANT
WICK EYE OCUCEL (MISCELLANEOUS) IMPLANT
WIPE NON LINTING 3.25X3.25 (MISCELLANEOUS) ×3 IMPLANT

## 2015-12-19 NOTE — Anesthesia Preprocedure Evaluation (Signed)
Anesthesia Evaluation  Patient identified by MRN, date of birth, ID band  Reviewed: NPO status   History of Anesthesia Complications Negative for: history of anesthetic complications  Airway Mallampati: II  TM Distance: >3 FB Neck ROM: full    Dental no notable dental hx.    Pulmonary neg pulmonary ROS,    Pulmonary exam normal        Cardiovascular Exercise Tolerance: Good hypertension, negative cardio ROS Normal cardiovascular exam     Neuro/Psych  Headaches, Neuropathy feet; glaucoma   negative psych ROS   GI/Hepatic Neg liver ROS, GERD  Medicated,  Endo/Other  Hypothyroidism   Renal/GU negative Renal ROS  negative genitourinary   Musculoskeletal   Abdominal   Peds  Hematology negative hematology ROS (+)   Anesthesia Other Findings   Reproductive/Obstetrics                             Anesthesia Physical Anesthesia Plan  ASA: II  Anesthesia Plan: MAC   Post-op Pain Management:    Induction:   Airway Management Planned:   Additional Equipment:   Intra-op Plan:   Post-operative Plan:   Informed Consent: I have reviewed the patients History and Physical, chart, labs and discussed the procedure including the risks, benefits and alternatives for the proposed anesthesia with the patient or authorized representative who has indicated his/her understanding and acceptance.     Plan Discussed with: CRNA  Anesthesia Plan Comments:         Anesthesia Quick Evaluation

## 2015-12-19 NOTE — Anesthesia Procedure Notes (Signed)
Procedure Name: MAC Performed by: Emmalise Huard Pre-anesthesia Checklist: Patient identified, Emergency Drugs available, Suction available, Timeout performed and Patient being monitored Patient Re-evaluated:Patient Re-evaluated prior to inductionOxygen Delivery Method: Nasal cannula Placement Confirmation: positive ETCO2     

## 2015-12-19 NOTE — H&P (Signed)
H+P reviewed and is up to date, please see paper chart.  

## 2015-12-19 NOTE — Op Note (Signed)
Date of Surgery: 12/19/2015  PREOPERATIVE DIAGNOSES: Visually significant nuclear sclerotic cataract, left eye.  POSTOPERATIVE DIAGNOSES: Same  PROCEDURES PERFORMED: Cataract extraction with intraocular lens implant, left eye.  SURGEON: Almon Hercules, M.D.  ANESTHESIA: MAC and topical  IMPLANTS: AcrySof IQ [id number] +7.5 D   Implant Name Type Inv. Item Serial No. Manufacturer Lot No. LRB No. Used  LENS IOL POST 7.5 - [id number] Intraocular Lens LENS IOL POST 7.5 [id number] ALCON   Left 1    COMPLICATIONS: None.  DESCRIPTION OF PROCEDURE: Therapeutic options were discussed with the patient preoperatively, including a discussion of risks and benefits of surgery. Informed consent was obtained. An IOL-Master and immersion biometry were used to take the lens measurements, and a dilated fundus exam was performed within 6 months of the surgical date.  The patient was premedicated and brought to the operating room and placed on the operating table in the supine position. After adequate anesthesia, the patient was prepped and draped in the usual sterile ophthalmic fashion. A wire lid speculum was inserted and the microscope was positioned. A Superblade was used to create a paracentesis site at the limbus and a small amount of dilute preservative free lidocaine was instilled into the anterior chamber, followed by dispersive viscoelastic. A clear corneal incision was created temporally using a 2.4 mm keratome blade. Capsulorrhexis was then performed. In situ phacoemulsification was performed.  Cortical material was removed with the irrigation-aspiration unit. Dispersive viscoelastic was instilled to open the capsular bag. A posterior chamber intraocular lens with the specifications above was inserted and positioned. Irrigation-aspiration was used to remove all viscoelastic. Vigamox 1cc was instilled into the anterior chamber, and the corneal incision was checked and found to be water tight. The eyelid  speculum was removed.  The operative eye was covered with protective goggles after instilling 1 drop of timolol and brimonidine. The patient tolerated the procedure well. There were no complications.

## 2015-12-19 NOTE — Transfer of Care (Signed)
Immediate Anesthesia Transfer of Care Note  Patient: Susan Byrd  Procedure(s) Performed: Procedure(s): CATARACT EXTRACTION PHACO AND INTRAOCULAR LENS PLACEMENT (IOC) (Left)  Patient Location: PACU  Anesthesia Type: MAC  Level of Consciousness: awake, alert  and patient cooperative  Airway and Oxygen Therapy: Patient Spontanous Breathing and Patient connected to supplemental oxygen  Post-op Assessment: Post-op Vital signs reviewed, Patient's Cardiovascular Status Stable, Respiratory Function Stable, Patent Airway and No signs of Nausea or vomiting  Post-op Vital Signs: Reviewed and stable  Complications: No apparent anesthesia complications

## 2015-12-19 NOTE — Anesthesia Postprocedure Evaluation (Signed)
Anesthesia Post Note  Patient: Susan Byrd  Procedure(s) Performed: Procedure(s) (LRB): CATARACT EXTRACTION PHACO AND INTRAOCULAR LENS PLACEMENT (IOC) (Left)  Patient location during evaluation: PACU Anesthesia Type: MAC Level of consciousness: awake and alert Pain management: pain level controlled Vital Signs Assessment: post-procedure vital signs reviewed and stable Respiratory status: spontaneous breathing, nonlabored ventilation, respiratory function stable and patient connected to nasal cannula oxygen Cardiovascular status: stable and blood pressure returned to baseline Anesthetic complications: no    Marshia Tropea

## 2015-12-20 ENCOUNTER — Encounter: Payer: Self-pay | Admitting: Ophthalmology

## 2016-01-02 ENCOUNTER — Encounter: Payer: Self-pay | Admitting: *Deleted

## 2016-01-02 ENCOUNTER — Ambulatory Visit (INDEPENDENT_AMBULATORY_CARE_PROVIDER_SITE_OTHER)
Admission: RE | Admit: 2016-01-02 | Discharge: 2016-01-02 | Disposition: A | Discharge status: Home / Self Care | Payer: Medicare Other | Source: Ambulatory Visit | Attending: Internal Medicine | Admitting: Internal Medicine

## 2016-01-02 ENCOUNTER — Encounter: Payer: Self-pay | Admitting: Internal Medicine

## 2016-01-02 ENCOUNTER — Ambulatory Visit (INDEPENDENT_AMBULATORY_CARE_PROVIDER_SITE_OTHER): Payer: Medicare Other | Admitting: Internal Medicine

## 2016-01-02 ENCOUNTER — Telehealth: Payer: Self-pay | Admitting: Family Medicine

## 2016-01-02 VITALS — BP 120/80 | HR 78 | Temp 97.7°F | Wt 197.0 lb

## 2016-01-02 DIAGNOSIS — S92912A Unspecified fracture of left toe(s), initial encounter for closed fracture: Secondary | ICD-10-CM | POA: Insufficient documentation

## 2016-01-02 DIAGNOSIS — S92512A Displaced fracture of proximal phalanx of left lesser toe(s), initial encounter for closed fracture: Secondary | ICD-10-CM | POA: Diagnosis not present

## 2016-01-02 DIAGNOSIS — H2511 Age-related nuclear cataract, right eye: Secondary | ICD-10-CM | POA: Diagnosis not present

## 2016-01-02 DIAGNOSIS — M79672 Pain in left foot: Secondary | ICD-10-CM

## 2016-01-02 NOTE — Assessment & Plan Note (Signed)
Metatarsals fine Walks well Buddy taped here and discussed time course Ice if pain after on it Ibuprofen prn

## 2016-01-02 NOTE — Progress Notes (Signed)
Pre visit review using our clinic review tool, if applicable. No additional management support is needed unless otherwise documented below in the visit note. 

## 2016-01-02 NOTE — Progress Notes (Signed)
Subjective:    Patient ID: Susan Byrd, female    DOB: [birth year], 70 y.o.   MRN: [id number]  HPI Here due to toe pain Was barefoot and walked into the leg of a stool Occurred 3 days ago Bent the left 5th toe sideways and it hurt a lot  Issues with vision in between eye surgery  She iced right away and repeatedly Ibuprofen 600mg  bid--this has helped Pain is much better now but it does hurt with walking  Current Outpatient Prescriptions on File Prior to Visit  Medication Sig Dispense Refill  . Estradiol 10 MCG TABS vaginal tablet One tablet three times per week 8 tablet 16  . ezetimibe-simvastatin (VYTORIN) 10-20 MG per tablet Take 1 tablet by mouth daily. 90 tablet 3  . levothyroxine (SYNTHROID, LEVOTHROID) 100 MCG tablet TAKE 1 TABLET BY MOUTH DAILY. 90 tablet 2  . spironolactone-hydrochlorothiazide (ALDACTAZIDE) 25-25 MG per tablet Take 1 tablet by mouth daily. 90 tablet 2   No current facility-administered medications on file prior to visit.    Allergies  Allergen Reactions  . Amoxicillin Rash       . Penicillins Rash       . Sulfonamide Derivatives Rash         Past Medical History  Diagnosis Date  . Hyperlipidemia   . Hypertension   . Thyroid disease   . High cholesterol   . Interdigital neuroma 07/29/2013    3RD IDS LEFT FOOT  . Migraine     rare  . GERD (gastroesophageal reflux disease)   . Motion sickness     reading in car    Past Surgical History  Procedure Laterality Date  . Cholecystectomy    . Abdominal hysterectomy    . Bunionectomy    . Colonoscopy    . Cataract extraction w/phaco Left 12/19/2015    Procedure: CATARACT EXTRACTION PHACO AND INTRAOCULAR LENS PLACEMENT (IOC);  Surgeon: Ronnell Freshwater, MD;  Location: [place];  Service: Ophthalmology;  Laterality: Left;    Family History  Problem Relation Age of Onset  . Cancer Mother     bone  . Stroke Father   . Heart disease Brother   . Hypertension Brother     . Heart disease Sister   . Hypertension Sister     Social History   Social History  . Marital Status: Married    Spouse Name: N/A  . Number of Children: N/A  . Years of Education: N/A   Occupational History  . Retired    Social History Main Topics  . Smoking status: Never Smoker   . Smokeless tobacco: Never Used  . Alcohol Use: Yes     Comment: 1-2 PER WEEK  . Drug Use: No  . Sexual Activity: Not on file     Comment: Husband decease   Other Topics Concern  . Not on file   Social History Narrative   Widowed 2014   Does have a living will.   Desires CPR- would not want prolonged life support if futile.   Review of Systems  Pain in lateral left calf this morning Thinks this is due to walking differently    Objective:   Physical Exam  Neurological:  Walks carefully but does bear weight on left foot Contusion of distal lateral dorsum of foot and at base of 3rd-5th toes on left Mild tenderness at MTP 3-5 on left More pain with lateral movement of 5th toe  Assessment & Plan:

## 2016-01-02 NOTE — Telephone Encounter (Signed)
[facility]  Patient Name: Susan Byrd  DOB: 09/13/1946    Initial Comment Caller states on Friday evening she stubbed her little toe, it's black and blue. Now her calf is sore, having pain.   Nurse Assessment  Nurse: Wynetta Emery, RN, Baker Janus Date/Time Eilene Ghazi Time): 01/02/2016 8:59:55 AM  Confirm and document reason for call. If symptomatic, describe symptoms. You must click the next button to save text entered. ---Leda Gauze stubbed little toe on leg of a stool Friday night turned black/blue and today has pain to calf of leg like stiff and toe hurts.  Has the patient traveled out of the country within the last 30 days? ---No  Does the patient have any new or worsening symptoms? ---Yes  Will a triage be completed? ---Yes  Related visit to physician within the last 2 weeks? ---No  Does the PT have any chronic conditions? (i.e. diabetes, asthma, etc.) ---No  Is this a behavioral health or substance abuse call? ---No     Guidelines    Guideline Title Affirmed Question Affirmed Notes  Toe Injury [1] Toe injury AND [2] bad limp or can't wear shoes/sandals    Final Disposition User   See Physician within 24 Hours Wynetta Emery, RN, Baker Janus    Comments  NOTE APPT GIVEN; 245pm with Viviana Simpler 01/02/2016 NO AVAILBALE appts with Dr. Deborra Medina today has other obligations for tomorrow   Referrals  REFERRED TO PCP OFFICE   Disagree/Comply: Comply

## 2016-01-02 NOTE — Telephone Encounter (Signed)
Will check at OV 

## 2016-01-02 NOTE — Assessment & Plan Note (Signed)
Not just in toes and 3 MTP joints tender Will check x-ray

## 2016-01-04 ENCOUNTER — Telehealth: Payer: Self-pay | Admitting: Family Medicine

## 2016-01-04 NOTE — Telephone Encounter (Signed)
An oral surgeon is a reasonable next step.  She would have to check with her insurance about coverage.

## 2016-01-04 NOTE — Telephone Encounter (Signed)
Pt called. Wants to know what the next step is concerning her mouth (sensation)? Has recently seen dentist and they suggested an oral surgeon. Do you have any recommendations for her and wants to know is that the next way to go? Also for her insurance will this be considered medical? Please advise.  [phone]

## 2016-01-05 NOTE — Discharge Instructions (Signed)

## 2016-01-05 NOTE — Telephone Encounter (Signed)
Spoke to pt and advised per Dr Deborra Medina. Pt verbally expressed understanding and will contact them directly

## 2016-01-09 ENCOUNTER — Ambulatory Visit: Payer: Medicare Other | Admitting: Anesthesiology

## 2016-01-09 ENCOUNTER — Encounter
Admission: RE | Disposition: A | Discharge status: Home / Self Care | Payer: Self-pay | Source: Ambulatory Visit | Attending: Ophthalmology

## 2016-01-09 ENCOUNTER — Encounter: Payer: Self-pay | Admitting: *Deleted

## 2016-01-09 ENCOUNTER — Ambulatory Visit
Admission: RE | Admit: 2016-01-09 | Discharge: 2016-01-09 | Disposition: A | Discharge status: Home / Self Care | Payer: Medicare Other | Source: Ambulatory Visit | Attending: Ophthalmology | Admitting: Ophthalmology

## 2016-01-09 DIAGNOSIS — Z90710 Acquired absence of both cervix and uterus: Secondary | ICD-10-CM | POA: Insufficient documentation

## 2016-01-09 DIAGNOSIS — G43909 Migraine, unspecified, not intractable, without status migrainosus: Secondary | ICD-10-CM | POA: Diagnosis not present

## 2016-01-09 DIAGNOSIS — H2511 Age-related nuclear cataract, right eye: Secondary | ICD-10-CM | POA: Insufficient documentation

## 2016-01-09 DIAGNOSIS — E7800 Pure hypercholesterolemia, unspecified: Secondary | ICD-10-CM | POA: Diagnosis not present

## 2016-01-09 DIAGNOSIS — I10 Essential (primary) hypertension: Secondary | ICD-10-CM | POA: Insufficient documentation

## 2016-01-09 DIAGNOSIS — Z79899 Other long term (current) drug therapy: Secondary | ICD-10-CM | POA: Insufficient documentation

## 2016-01-09 DIAGNOSIS — R112 Nausea with vomiting, unspecified: Secondary | ICD-10-CM | POA: Insufficient documentation

## 2016-01-09 DIAGNOSIS — Z880 Allergy status to penicillin: Secondary | ICD-10-CM | POA: Insufficient documentation

## 2016-01-09 DIAGNOSIS — Z9049 Acquired absence of other specified parts of digestive tract: Secondary | ICD-10-CM | POA: Diagnosis not present

## 2016-01-09 DIAGNOSIS — Z882 Allergy status to sulfonamides status: Secondary | ICD-10-CM | POA: Diagnosis not present

## 2016-01-09 DIAGNOSIS — K219 Gastro-esophageal reflux disease without esophagitis: Secondary | ICD-10-CM | POA: Diagnosis not present

## 2016-01-09 DIAGNOSIS — E039 Hypothyroidism, unspecified: Secondary | ICD-10-CM | POA: Insufficient documentation

## 2016-01-09 HISTORY — PX: CATARACT EXTRACTION W/PHACO: SHX586

## 2016-01-09 SURGERY — PHACOEMULSIFICATION, CATARACT, WITH IOL INSERTION
Anesthesia: Monitor Anesthesia Care | Laterality: Right | Wound class: Clean

## 2016-01-09 MED ORDER — TIMOLOL MALEATE 0.5 % OP SOLN
OPHTHALMIC | Status: DC | PRN
  Administered 2016-01-09: 1 [drp] via OPHTHALMIC

## 2016-01-09 MED ORDER — EPINEPHRINE HCL 1 MG/ML IJ SOLN
INTRAMUSCULAR | Status: DC | PRN
  Administered 2016-01-09: 76 mL via OPHTHALMIC

## 2016-01-09 MED ORDER — MIDAZOLAM HCL 2 MG/2ML IJ SOLN
INTRAMUSCULAR | Status: DC | PRN
  Administered 2016-01-09: 1 mg via INTRAVENOUS

## 2016-01-09 MED ORDER — MOXIFLOXACIN HCL 0.5 % OP SOLN
OPHTHALMIC | Status: DC | PRN
  Administered 2016-01-09: 1 [drp] via OPHTHALMIC

## 2016-01-09 MED ORDER — FENTANYL CITRATE (PF) 100 MCG/2ML IJ SOLN
INTRAMUSCULAR | Status: DC | PRN
  Administered 2016-01-09: 50 ug via INTRAVENOUS

## 2016-01-09 MED ORDER — TETRACAINE HCL 0.5 % OP SOLN
1.0000 [drp] | OPHTHALMIC | Status: DC | PRN
  Administered 2016-01-09: 1 [drp] via OPHTHALMIC

## 2016-01-09 MED ORDER — ACETAMINOPHEN 160 MG/5ML PO SOLN: 325.0000 mg | ORAL | Status: DC | PRN

## 2016-01-09 MED ORDER — LIDOCAINE HCL (PF) 4 % IJ SOLN
INTRAOCULAR | Status: DC | PRN
  Administered 2016-01-09: 1 mL via OPHTHALMIC

## 2016-01-09 MED ORDER — BRIMONIDINE TARTRATE 0.2 % OP SOLN
OPHTHALMIC | Status: DC | PRN
  Administered 2016-01-09: 1 [drp] via OPHTHALMIC

## 2016-01-09 MED ORDER — NA HYALUR & NA CHOND-NA HYALUR 0.4-0.35 ML IO KIT
PACK | INTRAOCULAR | Status: DC | PRN
  Administered 2016-01-09: 1 mL via INTRAOCULAR

## 2016-01-09 MED ORDER — POVIDONE-IODINE 5 % OP SOLN
1.0000 "application " | OPHTHALMIC | Status: DC | PRN
  Administered 2016-01-09: 1 via OPHTHALMIC

## 2016-01-09 MED ORDER — ACETAMINOPHEN 325 MG PO TABS: 325.0000 mg | ORAL_TABLET | ORAL | Status: DC | PRN

## 2016-01-09 MED ORDER — ARMC OPHTHALMIC DILATING GEL
1.0000 "application " | OPHTHALMIC | Status: DC | PRN
  Administered 2016-01-09 (×2): 1 via OPHTHALMIC

## 2016-01-09 SURGICAL SUPPLY — 28 items
APPLICATOR COTTON TIP 3IN (MISCELLANEOUS) ×3 IMPLANT
CANNULA ANT/CHMB 27GA (MISCELLANEOUS) ×3 IMPLANT
DISSECTOR HYDRO NUCLEUS 50X22 (MISCELLANEOUS) ×3 IMPLANT
GLOVE BIO SURGEON STRL SZ7 (GLOVE) ×3 IMPLANT
GLOVE SURG LX 6.5 MICRO (GLOVE) ×2
GLOVE SURG LX STRL 6.5 MICRO (GLOVE) ×1 IMPLANT
GOWN STRL REUS W/ TWL LRG LVL3 (GOWN DISPOSABLE) ×2 IMPLANT
GOWN STRL REUS W/TWL LRG LVL3 (GOWN DISPOSABLE) ×4
MARKER SKIN SURG W/RULER VIO (MISCELLANEOUS) ×3 IMPLANT
NEEDLE FILTER BLUNT 18X 1/2SAF (NEEDLE) ×2
NEEDLE FILTER BLUNT 18X1 1/2 (NEEDLE) ×1 IMPLANT
PACK CATARACT BRASINGTON (MISCELLANEOUS) ×3 IMPLANT
PACK EYE AFTER SURG (MISCELLANEOUS) ×3 IMPLANT
PACK OPTHALMIC (MISCELLANEOUS) ×3 IMPLANT
RING MALYGIN 7.0 (MISCELLANEOUS) IMPLANT
SN60WF 6.0D ×3 IMPLANT
SOL BAL SALT 15ML (MISCELLANEOUS)
SOLUTION BAL SALT 15ML (MISCELLANEOUS) IMPLANT
SUT ETHILON 10-0 CS-B-6CS-B-6 (SUTURE)
SUT VICRYL  9 0 (SUTURE)
SUT VICRYL 9 0 (SUTURE) IMPLANT
SUTURE EHLN 10-0 CS-B-6CS-B-6 (SUTURE) IMPLANT
SYR 3ML LL SCALE MARK (SYRINGE) ×3 IMPLANT
SYR TB 1ML LUER SLIP (SYRINGE) ×3 IMPLANT
WATER STERILE IRR 250ML POUR (IV SOLUTION) ×3 IMPLANT
WATER STERILE IRR 500ML POUR (IV SOLUTION) IMPLANT
WICK EYE OCUCEL (MISCELLANEOUS) IMPLANT
WIPE NON LINTING 3.25X3.25 (MISCELLANEOUS) ×3 IMPLANT

## 2016-01-09 NOTE — Op Note (Signed)
Date of Surgery: 01/09/2016  PREOPERATIVE DIAGNOSES: Visually significant nuclear sclerotic cataract, right eye.  POSTOPERATIVE DIAGNOSES: Same  PROCEDURES PERFORMED: Cataract extraction with intraocular lens implant, right eye.  SURGEON: Almon Hercules, M.D.  ANESTHESIA: MAC and topical  IMPLANTS: AcrySof IQ [id number] +6.0 D   Implant Name Type Inv. Item Serial No. Manufacturer Lot No. LRB No. Used  [id number] 6.0D     [id number] ALCON   Right 1     COMPLICATIONS: None.  DESCRIPTION OF PROCEDURE: Therapeutic options were discussed with the patient preoperatively, including a discussion of risks and benefits of surgery. Informed consent was obtained. An IOL-Master and immersion biometry were used to take the lens measurements, and a dilated fundus exam was performed within 6 months of the surgical date.  The patient was premedicated and brought to the operating room and placed on the operating table in the supine position. After adequate anesthesia, the patient was prepped and draped in the usual sterile ophthalmic fashion. A wire lid speculum was inserted and the microscope was positioned. A Superblade was used to create a paracentesis site at the limbus and a small amount of dilute preservative free lidocaine was instilled into the anterior chamber, followed by dispersive viscoelastic. A clear corneal incision was created temporally using a 2.4 mm keratome blade. Capsulorrhexis was then performed. In situ phacoemulsification was performed.  Cortical material was removed with the irrigation-aspiration unit. Dispersive viscoelastic was instilled to open the capsular bag. A posterior chamber intraocular lens with the specifications above was inserted and positioned. Irrigation-aspiration was used to remove all viscoelastic. Vigamox 1cc was instilled into the anterior chamber, and the corneal incision was checked and found to be water tight. The eyelid speculum was removed.  The operative eye was  covered with protective goggles after instilling 1 drop of timolol and brimonidine. The patient tolerated the procedure well. There were no complications.

## 2016-01-09 NOTE — Transfer of Care (Signed)
Immediate Anesthesia Transfer of Care Note  Patient: Susan Byrd  Procedure(s) Performed: Procedure(s): CATARACT EXTRACTION PHACO AND INTRAOCULAR LENS PLACEMENT (IOC) (Right)  Patient Location: PACU  Anesthesia Type: MAC  Level of Consciousness: awake, alert  and patient cooperative  Airway and Oxygen Therapy: Patient Spontanous Breathing and Patient connected to supplemental oxygen  Post-op Assessment: Post-op Vital signs reviewed, Patient's Cardiovascular Status Stable, Respiratory Function Stable, Patent Airway and No signs of Nausea or vomiting  Post-op Vital Signs: Reviewed and stable  Complications: No apparent anesthesia complications

## 2016-01-09 NOTE — Anesthesia Postprocedure Evaluation (Signed)
Anesthesia Post Note  Patient: Arrica Saidi Stiff  Procedure(s) Performed: Procedure(s) (LRB): CATARACT EXTRACTION PHACO AND INTRAOCULAR LENS PLACEMENT (IOC) (Right)  Patient location during evaluation: PACU Anesthesia Type: MAC Level of consciousness: awake and alert and oriented Pain management: satisfactory to patient Vital Signs Assessment: post-procedure vital signs reviewed and stable Respiratory status: spontaneous breathing, nonlabored ventilation and respiratory function stable Cardiovascular status: blood pressure returned to baseline and stable Postop Assessment: Adequate PO intake and No signs of nausea or vomiting Anesthetic complications: no    Raliegh Ip

## 2016-01-09 NOTE — Anesthesia Preprocedure Evaluation (Signed)
Anesthesia Evaluation  Patient identified by MRN, date of birth, ID band  Reviewed: Allergy & Precautions, H&P , NPO status , Patient's Chart, lab work & pertinent test results  Airway Mallampati: II  TM Distance: >3 FB Neck ROM: full    Dental no notable dental hx.    Pulmonary    Pulmonary exam normal        Cardiovascular hypertension,  Rhythm:regular Rate:Normal     Neuro/Psych    GI/Hepatic GERD  ,  Endo/Other  Hypothyroidism   Renal/GU      Musculoskeletal   Abdominal   Peds  Hematology   Anesthesia Other Findings   Reproductive/Obstetrics                             Anesthesia Physical Anesthesia Plan  ASA: II  Anesthesia Plan: MAC   Post-op Pain Management:    Induction:   Airway Management Planned:   Additional Equipment:   Intra-op Plan:   Post-operative Plan:   Informed Consent: I have reviewed the patients History and Physical, chart, labs and discussed the procedure including the risks, benefits and alternatives for the proposed anesthesia with the patient or authorized representative who has indicated his/her understanding and acceptance.     Plan Discussed with: CRNA  Anesthesia Plan Comments:         Anesthesia Quick Evaluation

## 2016-01-09 NOTE — Anesthesia Procedure Notes (Signed)
Procedure Name: MAC Performed by: Charlton Boule Pre-anesthesia Checklist: Patient identified, Emergency Drugs available, Suction available, Patient being monitored and Timeout performed Patient Re-evaluated:Patient Re-evaluated prior to inductionOxygen Delivery Method: Nasal cannula       

## 2016-01-10 ENCOUNTER — Encounter: Payer: Self-pay | Admitting: Ophthalmology

## 2016-02-06 ENCOUNTER — Telehealth: Payer: Self-pay

## 2016-02-06 DIAGNOSIS — R6884 Jaw pain: Principal | ICD-10-CM

## 2016-02-06 DIAGNOSIS — K118 Other diseases of salivary glands: Secondary | ICD-10-CM

## 2016-02-06 DIAGNOSIS — R220 Localized swelling, mass and lump, head: Secondary | ICD-10-CM

## 2016-02-06 NOTE — Telephone Encounter (Signed)
Pt left v/m; pt having pain in jaw;seen 10/31/15 submandibular gland pain; pt tried to be seen by oral surgeon at [facility] and needs referral from PCP. Pt wants to know if Dr Deborra Medina would do referral and when [facility] goes to make appt call pt and she will give name of physician pt wants to see.

## 2016-02-07 DIAGNOSIS — R220 Localized swelling, mass and lump, head: Secondary | ICD-10-CM | POA: Insufficient documentation

## 2016-02-07 NOTE — Telephone Encounter (Signed)
Referral placed.

## 2016-03-12 DIAGNOSIS — G501 Atypical facial pain: Secondary | ICD-10-CM | POA: Insufficient documentation

## 2016-03-28 DIAGNOSIS — H532 Diplopia: Secondary | ICD-10-CM | POA: Diagnosis not present

## 2016-03-28 DIAGNOSIS — H5005 Alternating esotropia: Secondary | ICD-10-CM | POA: Diagnosis not present

## 2016-03-28 DIAGNOSIS — H518 Other specified disorders of binocular movement: Secondary | ICD-10-CM | POA: Diagnosis not present

## 2016-05-07 ENCOUNTER — Ambulatory Visit (INDEPENDENT_AMBULATORY_CARE_PROVIDER_SITE_OTHER): Payer: Medicare Other | Admitting: Family Medicine

## 2016-05-07 ENCOUNTER — Encounter: Payer: Self-pay | Admitting: Family Medicine

## 2016-05-07 VITALS — BP 116/72 | HR 60 | Temp 98.3°F | Wt 195.8 lb

## 2016-05-07 DIAGNOSIS — M5010 Cervical disc disorder with radiculopathy, unspecified cervical region: Secondary | ICD-10-CM

## 2016-05-07 NOTE — Progress Notes (Signed)
Pre visit review using our clinic review tool, if applicable. No additional management support is needed unless otherwise documented below in the visit note. 

## 2016-05-07 NOTE — Progress Notes (Signed)
SUBJECTIVE:  Susan Byrd is a 70 y.o. female who complains of neck pain 2 month(s) ago. The pain is positional with movement of neck with radiation of pain down the arms.  Symptoms have been intermittent since that time. Prior history of neck problems: no prior neck problems. There is no weakness in the arms.  Current Outpatient Prescriptions on File Prior to Visit  Medication Sig Dispense Refill  . DUREZOL 0.05 % EMUL     . Estradiol 10 MCG TABS vaginal tablet One tablet three times per week 8 tablet 16  . ezetimibe-simvastatin (VYTORIN) 10-20 MG per tablet Take 1 tablet by mouth daily. 90 tablet 3  . ILEVRO 0.3 % ophthalmic suspension     . levothyroxine (SYNTHROID, LEVOTHROID) 100 MCG tablet TAKE 1 TABLET BY MOUTH DAILY. 90 tablet 2  . spironolactone-hydrochlorothiazide (ALDACTAZIDE) 25-25 MG per tablet Take 1 tablet by mouth daily. 90 tablet 2  . VIGAMOX 0.5 % ophthalmic solution      No current facility-administered medications on file prior to visit.    Allergies  Allergen Reactions  . Amoxicillin Rash       . Penicillins Rash       . Sulfonamide Derivatives Rash         Past Medical History  Diagnosis Date  . Hyperlipidemia   . Hypertension   . Thyroid disease   . High cholesterol   . Interdigital neuroma 07/29/2013    3RD IDS LEFT FOOT  . Migraine     rare  . GERD (gastroesophageal reflux disease)   . Motion sickness     reading in car    Past Surgical History  Procedure Laterality Date  . Cholecystectomy    . Abdominal hysterectomy    . Bunionectomy    . Colonoscopy    . Cataract extraction w/phaco Left 12/19/2015    Procedure: CATARACT EXTRACTION PHACO AND INTRAOCULAR LENS PLACEMENT (IOC);  Surgeon: Ronnell Freshwater, MD;  Location: [place];  Service: Ophthalmology;  Laterality: Left;  . Cataract extraction w/phaco Right 01/09/2016    Procedure: CATARACT EXTRACTION PHACO AND INTRAOCULAR LENS PLACEMENT (IOC);  Surgeon: Ronnell Freshwater, MD;  Location: [place];  Service: Ophthalmology;  Laterality: Right;    Family History  Problem Relation Age of Onset  . Cancer Mother     bone  . Stroke Father   . Heart disease Brother   . Hypertension Brother   . Heart disease Sister   . Hypertension Sister     Social History   Social History  . Marital Status: Married    Spouse Name: N/A  . Number of Children: N/A  . Years of Education: N/A   Occupational History  . Retired    Social History Main Topics  . Smoking status: Never Smoker   . Smokeless tobacco: Never Used  . Alcohol Use: Yes     Comment: 1-2 PER WEEK  . Drug Use: No  . Sexual Activity: Not on file     Comment: Husband decease   Other Topics Concern  . Not on file   Social History Narrative   Widowed 2014   Does have a living will.   Desires CPR- would not want prolonged life support if futile.   The PMH, PSH, Social History, Family History, Medications, and allergies have been reviewed in [facility], and have been updated if relevant.  OBJECTIVE: BP 116/72 mmHg  Pulse 60  Temp(Src) 98.3 F (36.8 C) (Oral)  Wt 195  lb 12 oz (88.792 kg)  SpO2 96%  Vital signs as noted above. Patient appears to be in mild to moderate pain.  Neck exam: normal C-spine, no tenderness, full ROM without pain, tenderness over trapezial muscles. Wrist left- + finkelstein X-Ray: not indicated.  ASSESSMENT:  exacerbation of trapezius spasm, left sided carpal tunnel and underlying chronic DJD likely  PLAN: rest the injured area as much as practical OTC wrist splint, neck massage Consider Physical Therapy and XRay studies if not improving.  Call or return to clinic prn if these symptoms worsen or fail to improve as anticipated.

## 2016-05-07 NOTE — Patient Instructions (Signed)
Great to see you. Try an OTC wrist splint and massage like we discussed.

## 2016-05-23 ENCOUNTER — Other Ambulatory Visit: Payer: Self-pay | Admitting: *Deleted

## 2016-05-23 MED ORDER — SPIRONOLACTONE-HCTZ 25-25 MG PO TABS: 1.0000 | ORAL_TABLET | Freq: Every day | ORAL | Status: DC

## 2016-05-23 NOTE — Telephone Encounter (Signed)
Received faxed refill request from pharmacy. Refill sent to pharmacy electronically. 

## 2016-06-11 DIAGNOSIS — H518 Other specified disorders of binocular movement: Secondary | ICD-10-CM | POA: Diagnosis not present

## 2016-06-11 DIAGNOSIS — H5005 Alternating esotropia: Secondary | ICD-10-CM | POA: Diagnosis not present

## 2016-06-11 DIAGNOSIS — H532 Diplopia: Secondary | ICD-10-CM | POA: Diagnosis not present

## 2016-06-28 DIAGNOSIS — H518 Other specified disorders of binocular movement: Secondary | ICD-10-CM | POA: Diagnosis not present

## 2016-06-28 DIAGNOSIS — I10 Essential (primary) hypertension: Secondary | ICD-10-CM | POA: Diagnosis not present

## 2016-06-28 DIAGNOSIS — H5000 Unspecified esotropia: Secondary | ICD-10-CM | POA: Diagnosis not present

## 2016-06-28 DIAGNOSIS — E785 Hyperlipidemia, unspecified: Secondary | ICD-10-CM | POA: Diagnosis not present

## 2016-06-28 DIAGNOSIS — Z9889 Other specified postprocedural states: Secondary | ICD-10-CM | POA: Insufficient documentation

## 2016-06-28 DIAGNOSIS — Z79899 Other long term (current) drug therapy: Secondary | ICD-10-CM | POA: Diagnosis not present

## 2016-06-28 DIAGNOSIS — E039 Hypothyroidism, unspecified: Secondary | ICD-10-CM | POA: Diagnosis not present

## 2016-06-28 HISTORY — PX: EYE MUSCLE SURGERY: SHX370

## 2016-07-13 ENCOUNTER — Other Ambulatory Visit: Payer: Self-pay | Admitting: Internal Medicine

## 2016-07-13 DIAGNOSIS — E039 Hypothyroidism, unspecified: Secondary | ICD-10-CM

## 2016-07-13 DIAGNOSIS — Z1159 Encounter for screening for other viral diseases: Secondary | ICD-10-CM

## 2016-07-13 DIAGNOSIS — E559 Vitamin D deficiency, unspecified: Secondary | ICD-10-CM

## 2016-07-13 DIAGNOSIS — E785 Hyperlipidemia, unspecified: Secondary | ICD-10-CM

## 2016-07-17 ENCOUNTER — Other Ambulatory Visit: Payer: Self-pay | Admitting: *Deleted

## 2016-07-17 MED ORDER — ESTRADIOL 10 MCG VA TABS: ORAL_TABLET | VAGINAL | 16 refills | Status: DC

## 2016-07-20 ENCOUNTER — Ambulatory Visit (INDEPENDENT_AMBULATORY_CARE_PROVIDER_SITE_OTHER): Payer: Medicare Other

## 2016-07-20 ENCOUNTER — Other Ambulatory Visit (INDEPENDENT_AMBULATORY_CARE_PROVIDER_SITE_OTHER): Payer: Medicare Other

## 2016-07-20 VITALS — BP 114/82 | HR 60 | Temp 98.0°F | Ht 67.5 in | Wt 188.8 lb

## 2016-07-20 DIAGNOSIS — Z0000 Encounter for general adult medical examination without abnormal findings: Secondary | ICD-10-CM | POA: Diagnosis not present

## 2016-07-20 DIAGNOSIS — E785 Hyperlipidemia, unspecified: Secondary | ICD-10-CM | POA: Diagnosis not present

## 2016-07-20 DIAGNOSIS — E039 Hypothyroidism, unspecified: Secondary | ICD-10-CM | POA: Diagnosis not present

## 2016-07-20 DIAGNOSIS — E559 Vitamin D deficiency, unspecified: Secondary | ICD-10-CM | POA: Diagnosis not present

## 2016-07-20 DIAGNOSIS — Z1159 Encounter for screening for other viral diseases: Secondary | ICD-10-CM | POA: Diagnosis not present

## 2016-07-20 LAB — TSH: TSH: 0.58 u[IU]/mL (ref 0.35–4.50)

## 2016-07-20 LAB — COMPREHENSIVE METABOLIC PANEL
ALT: 21 U/L (ref 0–35)
AST: 19 U/L (ref 0–37)
Albumin: 4.1 g/dL (ref 3.5–5.2)
Alkaline Phosphatase: 52 U/L (ref 39–117)
BUN: 20 mg/dL (ref 6–23)
CHLORIDE: 104 meq/L (ref 96–112)
CO2: 31 meq/L (ref 19–32)
CREATININE: 0.95 mg/dL (ref 0.40–1.20)
Calcium: 9.8 mg/dL (ref 8.4–10.5)
GFR: 61.75 mL/min (ref >60.00)
Glucose, Bld: 107 mg/dL — ABNORMAL HIGH (ref 70–99)
Potassium: 4.2 mEq/L (ref 3.5–5.1)
SODIUM: 141 meq/L (ref 135–145)
Total Bilirubin: 0.7 mg/dL (ref 0.2–1.2)
Total Protein: 7.1 g/dL (ref 6.0–8.3)

## 2016-07-20 LAB — CBC WITH DIFFERENTIAL/PLATELET
BASOS PCT: 0.5 % (ref 0.0–3.0)
Basophils Absolute: 0 10*3/uL (ref 0.0–0.1)
EOS ABS: 0 10*3/uL (ref 0.0–0.7)
Eosinophils Relative: 0.8 % (ref 0.0–5.0)
HCT: 43.2 % (ref 36.0–46.0)
Hemoglobin: 14.4 g/dL (ref 12.0–15.0)
LYMPHS ABS: 1.5 10*3/uL (ref 0.7–4.0)
Lymphocytes Relative: 26.2 % (ref 12.0–46.0)
MCHC: 33.4 g/dL (ref 30.0–36.0)
MCV: 88.3 fl (ref 78.0–100.0)
MONO ABS: 0.5 10*3/uL (ref 0.1–1.0)
Monocytes Relative: 8.6 % (ref 3.0–12.0)
NEUTROS ABS: 3.7 10*3/uL (ref 1.4–7.7)
Neutrophils Relative %: 63.9 % (ref 43.0–77.0)
PLATELETS: 224 10*3/uL (ref 150.0–400.0)
RBC: 4.9 Mil/uL (ref 3.87–5.11)
RDW: 14.6 % (ref 11.5–15.5)
WBC: 5.8 10*3/uL (ref 4.0–10.5)

## 2016-07-20 LAB — LIPID PANEL
CHOL/HDL RATIO: 3
Cholesterol: 141 mg/dL (ref 0–200)
HDL: 42.5 mg/dL (ref >39.00)
LDL Cholesterol: 67 mg/dL (ref 0–99)
NONHDL: 98.03
TRIGLYCERIDES: 154 mg/dL — AB (ref 0.0–149.0)
VLDL: 30.8 mg/dL (ref 0.0–40.0)

## 2016-07-20 LAB — T4, FREE: FREE T4: 1.18 ng/dL (ref 0.60–1.60)

## 2016-07-20 LAB — VITAMIN D 25 HYDROXY (VIT D DEFICIENCY, FRACTURES): VITD: 37.39 ng/mL (ref 30.00–100.00)

## 2016-07-20 NOTE — Progress Notes (Signed)
PCP notes:   Health maintenance:  Flu vaccine - addressed  Abnormal screenings:   None  Patient concerns:   None  Nurse concerns:  None  Next PCP appt:   07/26/16 @ 0915

## 2016-07-20 NOTE — Progress Notes (Signed)
I reviewed health advisor's note, was available for consultation, and agree with documentation and plan.  Berta Denson, MD [facility] [place]  

## 2016-07-20 NOTE — Progress Notes (Signed)
Subjective:   Susan Byrd is a 70 y.o. female who presents for Medicare Annual/Subsequent preventive examination.  Review of Systems:  N/A Cardiac Risk Factors include: advanced age (>33men, >81 women);dyslipidemia;hypertension     Objective:    Vitals: BP 114/82 (BP Location: Left Arm, Patient Position: Sitting, Cuff Size: Normal)   Pulse 60   Temp 98 F (36.7 C) (Oral)   Ht 5' 7.5" (1.715 m) Comment: no shoes  Wt 188 lb 12 oz (85.6 kg)   SpO2 94%   BMI 29.13 kg/m   Body mass index is 29.13 kg/m.  Tobacco History  Smoking Status  . Never Smoker  Smokeless Tobacco  . Never Used     Counseling given: No   Past Medical History:  Diagnosis Date  . GERD (gastroesophageal reflux disease)   . High cholesterol   . Hyperlipidemia   . Hypertension   . Interdigital neuroma 07/29/2013   3RD IDS LEFT FOOT  . Migraine    rare  . Motion sickness    reading in car  . Thyroid disease    Past Surgical History:  Procedure Laterality Date  . ABDOMINAL HYSTERECTOMY    . BUNIONECTOMY    . CATARACT EXTRACTION W/PHACO Left 12/19/2015   Procedure: CATARACT EXTRACTION PHACO AND INTRAOCULAR LENS PLACEMENT (IOC);  Surgeon: Ronnell Freshwater, MD;  Location: [place];  Service: Ophthalmology;  Laterality: Left;  . CATARACT EXTRACTION W/PHACO Right 01/09/2016   Procedure: CATARACT EXTRACTION PHACO AND INTRAOCULAR LENS PLACEMENT (IOC);  Surgeon: Ronnell Freshwater, MD;  Location: [place];  Service: Ophthalmology;  Laterality: Right;  . CHOLECYSTECTOMY    . COLONOSCOPY    . EYE MUSCLE SURGERY Left 06/28/2016   strabismus correction   Family History  Problem Relation Age of Onset  . Cancer Mother     bone  . Stroke Father   . Heart disease Sister   . Hypertension Sister   . Heart disease Brother   . Hypertension Brother    History  Sexual Activity  . Sexual activity: No    Comment: Husband decease    Outpatient Encounter  Prescriptions as of 07/20/2016  Medication Sig  . Estradiol 10 MCG TABS vaginal tablet One tablet three times per week  . ezetimibe-simvastatin (VYTORIN) 10-20 MG per tablet Take 1 tablet by mouth daily.  Marland Kitchen latanoprost (XALATAN) 0.005 % ophthalmic solution Place 1 drop into both eyes at bedtime.  Marland Kitchen levothyroxine (SYNTHROID, LEVOTHROID) 100 MCG tablet TAKE 1 TABLET BY MOUTH DAILY.  Marland Kitchen spironolactone-hydrochlorothiazide (ALDACTAZIDE) 25-25 MG tablet Take 1 tablet by mouth daily.  . [DISCONTINUED] DUREZOL 0.05 % EMUL   . [DISCONTINUED] ILEVRO 0.3 % ophthalmic suspension   . [DISCONTINUED] VIGAMOX 0.5 % ophthalmic solution    No facility-administered encounter medications on file as of 07/20/2016.     Activities of Daily Living In your present state of health, do you have any difficulty performing the following activities: 07/20/2016 01/09/2016  Hearing? N N  Vision? N Y  Difficulty concentrating or making decisions? N N  Walking or climbing stairs? N N  Dressing or bathing? N N  Doing errands, shopping? N -  Preparing Food and eating ? N -  Using the Toilet? N -  In the past six months, have you accidently leaked urine? Y -  Do you have problems with loss of bowel control? N -  Managing your Medications? N -  Managing your Finances? N -  Housekeeping or managing your Housekeeping? N -  Some recent data might be hidden    Patient Care Team: Lucille Passy, MD as PCP - General Emily Filbert, MD as Consulting Physician (Obstetrics and Gynecology) Philis Kendall, MD as Orthopedic Physician Assistant - Certified (Unknown Physician Specialty) [place], DPM as Consulting Physician (Podiatry) Ronnell Freshwater, MD as Referring Physician (Ophthalmology) Bethann Goo, MD as Referring Physician (Ophthalmology)   Assessment:     Hearing Screening   125Hz  250Hz  500Hz  1000Hz  2000Hz  3000Hz  4000Hz  6000Hz  8000Hz   Right ear:   40 40 40  40    Left ear:   40 40 40  40    Vision Screening  Comments: Last vision exam with Dr. Cephus Shelling in June 2017   Exercise Activities and Dietary recommendations Current Exercise Habits: Home exercise routine, Type of exercise: walking, Time (Minutes): 30, Frequency (Times/Week): 3, Weekly Exercise (Minutes/Week): 90, Intensity: Moderate, Exercise limited by: None identified  Goals    . Increase physical activity          Starting 07/20/16, I will continue to exercise for at least 30 min 2-3 days per week.       Fall Risk Fall Risk  07/20/2016 07/26/2015 07/07/2014  Falls in the past year? No No No   Depression Screen PHQ 2/9 Scores 07/20/2016 07/26/2015 07/07/2014  PHQ - 2 Score 0 0 0    Cognitive Testing MMSE - Mini Mental State Exam 07/20/2016  Orientation to time 5  Orientation to Place 5  Registration 3  Attention/ Calculation 0  Recall 3  Language- name 2 objects 0  Language- repeat 1  Language- follow 3 step command 3  Language- read & follow direction 0  Write a sentence 0  Copy design 0  Total score 20   PLEASE NOTE: A Mini-Cog screen was completed. Maximum score is 20. A value of 0 denotes this part of Folstein MMSE was not completed or the patient failed this part of the Mini-Cog screening.   Mini-Cog Screening Orientation to Time - Max 5 pts Orientation to Place - Max 5 pts Registration - Max 3 pts Recall - Max 3 pts Language Repeat - Max 1 pts Language Follow 3 Step Command - Max 3 pts  Immunization History  Administered Date(s) Administered  . Hep A / Hep B 11/19/2012, 11/28/2012, 12/24/2012  . Influenza Split 09/12/2011, 11/04/2012  . Influenza Whole 09/14/2010  . Influenza,inj,Quad PF,36+ Mos 11/19/2013, 09/23/2014, 07/26/2015  . Pneumococcal Conjugate-13 07/07/2014  . Pneumococcal Polysaccharide-23 02/26/2012  . Td 02/26/2012  . Zoster 09/14/2010   Screening Tests Health Maintenance  Topic Date Due  . INFLUENZA VACCINE  12/02/2016 (Originally 07/03/2016)  . MAMMOGRAM  09/12/2016  . COLONOSCOPY  03/07/2021    . TETANUS/TDAP  02/25/2022  . DEXA SCAN  Completed  . ZOSTAVAX  Completed  . Hepatitis C Screening  Completed  . PNA vac Low Risk Adult  Completed      Plan:     I have personally reviewed and addressed the Medicare Annual Wellness questionnaire and have noted the following in the patient's chart:  A. Medical and social history B. Use of alcohol, tobacco or illicit drugs  C. Current medications and supplements D. Functional ability and status E.  Nutritional status F.  Physical activity G. Advance directives H. List of other physicians I.  Hospitalizations, surgeries, and ER visits in previous 12 months J.  [place] to include hearing, vision, cognitive, depression L. Referrals and appointments - none  In addition, I  have reviewed and discussed with patient certain preventive protocols, quality metrics, and best practice recommendations. A written personalized care plan for preventive services as well as general preventive health recommendations were provided to patient.  See attached scanned questionnaire for additional information.   Signed,   Lindell Noe, MHA, BS, LPN Health Advisor

## 2016-07-20 NOTE — Progress Notes (Signed)
Pre visit review using our clinic review tool, if applicable. No additional management support is needed unless otherwise documented below in the visit note. 

## 2016-07-20 NOTE — Patient Instructions (Addendum)
Susan Byrd , Thank you for taking time to come for your Medicare Wellness Visit. I appreciate your ongoing commitment to your health goals. Please review the following plan we discussed and let me know if I can assist you in the future.   These are the goals we discussed: Goals    . Increase physical activity          Starting 07/20/16, I will continue to exercise for at least 30 min 2-3 days per week.        This is a list of the screening recommended for you and due dates:  Health Maintenance  Topic Date Due  . Flu Shot  12/02/2016*  . Mammogram  09/12/2016  . Colon Cancer Screening  03/07/2021  . Tetanus Vaccine  02/25/2022  . DEXA scan (bone density measurement)  Completed  . Shingles Vaccine  Completed  .  Hepatitis C: One time screening is recommended by Center for Disease Control  (CDC) for  adults born from [birth year] through 1965.   Completed  . Pneumonia vaccines  Completed  *Topic was postponed. The date shown is not the original due date.     Preventive Care for Adults  A healthy lifestyle and preventive care can promote health and wellness. Preventive health guidelines for adults include the following key practices.  . A routine yearly physical is a good way to check with your health care provider about your health and preventive screening. It is a chance to share any concerns and updates on your health and to receive a thorough exam.  . Visit your dentist for a routine exam and preventive care every 6 months. Brush your teeth twice a day and floss once a day. Good oral hygiene prevents tooth decay and gum disease.  . The frequency of eye exams is based on your age, health, family medical history, use  of contact lenses, and other factors. Follow your health care provider's ecommendations for frequency of eye exams.  . Eat a healthy diet. Foods like vegetables, fruits, whole grains, low-fat dairy products, and lean protein foods contain the nutrients you need without too  many calories. Decrease your intake of foods high in solid fats, added sugars, and salt. Eat the right amount of calories for you. Get information about a proper diet from your health care provider, if necessary.  . Regular physical exercise is one of the most important things you can do for your health. Most adults should get at least 150 minutes of moderate-intensity exercise (any activity that increases your heart rate and causes you to sweat) each week. In addition, most adults need muscle-strengthening exercises on 2 or more days a week.  Silver Sneakers may be a benefit available to you. To determine eligibility, you may visit the website: www.silversneakers.com or contact program at 325-061-7076 Mon-Fri between 8AM-8PM.   . Maintain a healthy weight. The body mass index (BMI) is a screening tool to identify possible weight problems. It provides an estimate of body fat based on height and weight. Your health care provider can find your BMI and can help you achieve or maintain a healthy weight.   For adults 20 years and older: ? A BMI below 18.5 is considered underweight. ? A BMI of 18.5 to 24.9 is normal. ? A BMI of 25 to 29.9 is considered overweight. ? A BMI of 30 and above is considered obese.   . Maintain normal blood lipids and cholesterol levels by exercising and minimizing your intake of saturated  fat. Eat a balanced diet with plenty of fruit and vegetables. Blood tests for lipids and cholesterol should begin at age 34 and be repeated every 5 years. If your lipid or cholesterol levels are high, you are over 50, or you are at high risk for heart disease, you may need your cholesterol levels checked more frequently. Ongoing high lipid and cholesterol levels should be treated with medicines if diet and exercise are not working.  . If you smoke, find out from your health care provider how to quit. If you do not use tobacco, please do not start.  . If you choose to drink alcohol, please  do not consume more than 2 drinks per day. One drink is considered to be 12 ounces (355 mL) of beer, 5 ounces (148 mL) of wine, or 1.5 ounces (44 mL) of liquor.  . If you are 36-4 years old, ask your health care provider if you should take aspirin to prevent strokes.  . Use sunscreen. Apply sunscreen liberally and repeatedly throughout the day. You should seek shade when your shadow is shorter than you. Protect yourself by wearing long sleeves, pants, a wide-brimmed hat, and sunglasses year round, whenever you are outdoors.  . Once a month, do a whole body skin exam, using a mirror to look at the skin on your back. Tell your health care provider of new moles, moles that have irregular borders, moles that are larger than a pencil eraser, or moles that have changed in shape or color.

## 2016-07-21 LAB — HEPATITIS C ANTIBODY: HCV AB: NEGATIVE

## 2016-07-26 ENCOUNTER — Ambulatory Visit (INDEPENDENT_AMBULATORY_CARE_PROVIDER_SITE_OTHER): Payer: Medicare Other | Admitting: Family Medicine

## 2016-07-26 ENCOUNTER — Encounter: Payer: Self-pay | Admitting: Family Medicine

## 2016-07-26 VITALS — BP 122/62 | HR 66 | Temp 97.5°F | Ht 68.0 in | Wt 190.0 lb

## 2016-07-26 DIAGNOSIS — E038 Other specified hypothyroidism: Secondary | ICD-10-CM

## 2016-07-26 DIAGNOSIS — E785 Hyperlipidemia, unspecified: Secondary | ICD-10-CM | POA: Diagnosis not present

## 2016-07-26 DIAGNOSIS — I10 Essential (primary) hypertension: Secondary | ICD-10-CM | POA: Diagnosis not present

## 2016-07-26 NOTE — Patient Instructions (Signed)
Great to see you! Keep up the great work!!    

## 2016-07-26 NOTE — Progress Notes (Signed)
Pre visit review using our clinic review tool, if applicable. No additional management support is needed unless otherwise documented below in the visit note. 

## 2016-07-26 NOTE — Assessment & Plan Note (Signed)
Euthyroid.  No changes made to current rxs.

## 2016-07-26 NOTE — Assessment & Plan Note (Signed)
Well controlled. No changes made to current rxs.  

## 2016-07-26 NOTE — Progress Notes (Signed)
Subjective:   Patient ID: Susan Byrd, female    DOB: June 21, 1946, 70 y.o.   MRN: [id number]  Susan Byrd is a pleasant 70 y.o. year old female who presents to clinic today with Follow-up  and follow up of chronic medical conditions on 07/26/2016  HPI:  Saw Candis Musa, RN for medicare wellness visit on 07/20/16. Note reviewed.   Hypothyroidism- on Synthroid 100 micrograms daily.  Denies any symptoms of hypo or hyperthyroidism. Wt Readings from Last 3 Encounters:  07/26/16 190 lb (86.2 kg)  07/20/16 188 lb 12 oz (85.6 kg)  05/07/16 195 lb 12 oz (88.8 kg)    Lab Results  Component Value Date   TSH 0.58 07/20/2016    HLD- has been on Vytorin 10-20 mg daily.  Continues to work on her diet.  Continues to lose weight. Denies myalgias.  Lab Results  Component Value Date   CHOL 141 07/20/2016   HDL 42.50 07/20/2016   LDLCALC 67 07/20/2016   TRIG 154.0 (H) 07/20/2016   CHOLHDL 3 07/20/2016   Lab Results  Component Value Date   ALT 21 07/20/2016   AST 19 07/20/2016   ALKPHOS 52 07/20/2016   BILITOT 0.7 07/20/2016      HTN- has been on Spironolactone/HCTZ 25/25 daily.  No CP, SOB, LE edema or blurred vision.  Lab Results  Component Value Date   CREATININE 0.95 07/20/2016    Lab Results  Component Value Date   WBC 5.8 07/20/2016   HGB 14.4 07/20/2016   HCT 43.2 07/20/2016   MCV 88.3 07/20/2016   PLT 224.0 07/20/2016     Current Outpatient Prescriptions on File Prior to Visit  Medication Sig Dispense Refill  . Estradiol 10 MCG TABS vaginal tablet One tablet three times per week 8 tablet 16  . ezetimibe-simvastatin (VYTORIN) 10-20 MG per tablet Take 1 tablet by mouth daily. 90 tablet 3  . latanoprost (XALATAN) 0.005 % ophthalmic solution Place 1 drop into both eyes at bedtime.    Marland Kitchen levothyroxine (SYNTHROID, LEVOTHROID) 100 MCG tablet TAKE 1 TABLET BY MOUTH DAILY. 90 tablet 2  . spironolactone-hydrochlorothiazide (ALDACTAZIDE) 25-25 MG tablet Take 1  tablet by mouth daily. 90 tablet 0   No current facility-administered medications on file prior to visit.     Allergies  Allergen Reactions  . Amoxicillin Rash       . Penicillins Rash       . Sulfonamide Derivatives Rash         Past Medical History:  Diagnosis Date  . GERD (gastroesophageal reflux disease)   . High cholesterol   . Hyperlipidemia   . Hypertension   . Interdigital neuroma 07/29/2013   3RD IDS LEFT FOOT  . Migraine    rare  . Motion sickness    reading in car  . Thyroid disease     Past Surgical History:  Procedure Laterality Date  . ABDOMINAL HYSTERECTOMY    . BUNIONECTOMY    . CATARACT EXTRACTION W/PHACO Left 12/19/2015   Procedure: CATARACT EXTRACTION PHACO AND INTRAOCULAR LENS PLACEMENT (IOC);  Surgeon: Ronnell Freshwater, MD;  Location: [facility];  Service: Ophthalmology;  Laterality: Left;  . CATARACT EXTRACTION W/PHACO Right 01/09/2016   Procedure: CATARACT EXTRACTION PHACO AND INTRAOCULAR LENS PLACEMENT (IOC);  Surgeon: Ronnell Freshwater, MD;  Location: [facility];  Service: Ophthalmology;  Laterality: Right;  . CHOLECYSTECTOMY    . COLONOSCOPY    . EYE MUSCLE SURGERY Left 06/28/2016   strabismus correction  Family History  Problem Relation Age of Onset  . Cancer Mother     bone  . Stroke Father   . Heart disease Sister   . Hypertension Sister   . Heart disease Brother   . Hypertension Brother     Social History   Social History  . Marital status: Married    Spouse name: N/A  . Number of children: N/A  . Years of education: N/A   Occupational History  . Retired Retired   Social History Main Topics  . Smoking status: Never Smoker  . Smokeless tobacco: Never Used  . Alcohol use Yes     Comment: 1-2 PER WEEK  . Drug use: No  . Sexual activity: No     Comment: Husband decease   Other Topics Concern  . Not on file   Social History Narrative   Widowed 2014   Does have a living will.    Desires CPR- would not want prolonged life support if futile.   The PMH, PSH, Social History, Family History, Medications, and allergies have been reviewed in [facility], and have been updated if relevant.    Review of Systems  Constitutional: Negative.   HENT: Negative.   Eyes: Negative.   Respiratory: Negative.   Cardiovascular: Negative.   Gastrointestinal: Negative.   Endocrine: Negative.   Genitourinary: Negative.   Musculoskeletal: Negative.   Skin: Negative.   Allergic/Immunologic: Negative.   Neurological: Negative.   Hematological: Negative.   Psychiatric/Behavioral: Negative.   All other systems reviewed and are negative.      Wt Readings from Last 3 Encounters:  07/26/16 190 lb (86.2 kg)  07/20/16 188 lb 12 oz (85.6 kg)  05/07/16 195 lb 12 oz (88.8 kg)     Objective:    BP 122/62   Pulse 66   Temp 97.5 F (36.4 C) (Oral)   Ht 5\' 8"  (1.727 m)   Wt 190 lb (86.2 kg)   SpO2 97%   BMI 28.89 kg/m   Wt Readings from Last 3 Encounters:  07/26/16 190 lb (86.2 kg)  07/20/16 188 lb 12 oz (85.6 kg)  05/07/16 195 lb 12 oz (88.8 kg)    Physical Exam  Constitutional: She is oriented to person, place, and time. She appears well-developed and well-nourished. No distress.  HENT:  Head: Normocephalic and atraumatic.  Eyes: Pupils are equal, round, and reactive to light.  Neck: Normal range of motion. Neck supple. No thyromegaly present.  Pulmonary/Chest: Effort normal and breath sounds normal.  Abdominal: Soft. Bowel sounds are normal. She exhibits no distension.  Musculoskeletal: Normal range of motion.  Neurological: She is alert and oriented to person, place, and time. No cranial nerve deficit. Coordination normal.  No tremor  Skin: Skin is warm and dry.  Psychiatric: She has a normal mood and affect. Her behavior is normal. Judgment and thought content normal.  Nursing note and vitals reviewed.         Assessment & Plan:   Essential hypertension  Other  specified hypothyroidism  HLD (hyperlipidemia) No Follow-up on file.

## 2016-07-26 NOTE — Assessment & Plan Note (Signed)
Well controlled on current dose of Vytorin. No changes made today.

## 2016-08-08 ENCOUNTER — Other Ambulatory Visit: Payer: Self-pay | Admitting: Family Medicine

## 2016-08-08 DIAGNOSIS — Z1231 Encounter for screening mammogram for malignant neoplasm of breast: Secondary | ICD-10-CM

## 2016-08-10 ENCOUNTER — Ambulatory Visit (INDEPENDENT_AMBULATORY_CARE_PROVIDER_SITE_OTHER): Payer: Medicare Other | Admitting: Family Medicine

## 2016-08-10 ENCOUNTER — Encounter: Payer: Self-pay | Admitting: Family Medicine

## 2016-08-10 VITALS — BP 112/72 | HR 72 | Temp 98.2°F | Ht 68.0 in | Wt 190.8 lb

## 2016-08-10 DIAGNOSIS — Z23 Encounter for immunization: Secondary | ICD-10-CM | POA: Diagnosis not present

## 2016-08-10 DIAGNOSIS — M79662 Pain in left lower leg: Secondary | ICD-10-CM

## 2016-08-10 NOTE — Progress Notes (Signed)
Pre visit review using our clinic review tool, if applicable. No additional management support is needed unless otherwise documented below in the visit note. 

## 2016-08-10 NOTE — Patient Instructions (Signed)
Continue to use ibuprofen 2-3 tablets every 8 - 12 hours as needed  Use heat/ice for comfort and do gentle range of motion exercises several times a day  Can also use compression socks as weather allows

## 2016-08-10 NOTE — Progress Notes (Signed)
Subjective:    Patient ID: Susan Byrd, female    DOB: Apr 20, 1946, 70 y.o.   MRN: [id number]  HPI This is a 70 yo female who presents today with left anterior shin pain for about 10 days. 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 No known injury. Pain is intermittent, sharp and shooting. Had difficulty walking yesterday afternoon. Has been taking ibuprofen 3 tablets a couple of times a day with some relief. Resting helps. Pain worse after being on feet for awhile. No pain at night and pain resolved in the morning. Pain has started to radiate around to calf a little. No redness, no swelling, no bruising. Has varicose veins on both legs, no change, no pain, no redness. Does not often wear compression or support stockings. Usually wears support/orthopedic sandals.   Past Medical History:  Diagnosis Date  . GERD (gastroesophageal reflux disease)   . High cholesterol   . Hyperlipidemia   . Hypertension   . Interdigital neuroma 07/29/2013   3RD IDS LEFT FOOT  . Migraine    rare  . Motion sickness    reading in car  . Thyroid disease    Past Surgical History:  Procedure Laterality Date  . ABDOMINAL HYSTERECTOMY    . BUNIONECTOMY    . CATARACT EXTRACTION W/PHACO Left 12/19/2015   Procedure: CATARACT EXTRACTION PHACO AND INTRAOCULAR LENS PLACEMENT (IOC);  Surgeon: Ronnell Freshwater, MD;  Location: [place];  Service: Ophthalmology;  Laterality: Left;  . CATARACT EXTRACTION W/PHACO Right 01/09/2016   Procedure: CATARACT EXTRACTION PHACO AND INTRAOCULAR LENS PLACEMENT (IOC);  Surgeon: Ronnell Freshwater, MD;  Location: [place] [place];  Service: Ophthalmology;  Laterality: Right;  . CHOLECYSTECTOMY    . COLONOSCOPY    . EYE MUSCLE SURGERY Left 06/28/2016   strabismus correction   Family History  Problem Relation Age of Onset  . Cancer Mother     bone  . Stroke Father   .  Heart disease Sister   . Hypertension Sister   . Heart disease Brother   . Hypertension Brother    Social History  Substance Use Topics  . Smoking status: Never Smoker  . Smokeless tobacco: Never Used  . Alcohol use Yes     Comment: 1-2 PER WEEK      Review of Systems Per HPI    Objective:   Physical Exam  Constitutional: She is oriented to person, place, and time. She appears well-developed and well-nourished. No distress.  HENT:  Head: Normocephalic.  Eyes: Conjunctivae are normal.  Cardiovascular: Normal rate.   Pulmonary/Chest: Effort normal.  Musculoskeletal: Normal range of motion.       Left lower leg: She exhibits bony tenderness (tender over distal third shin, no ecchymosis. ). She exhibits no tenderness, no swelling, no edema and no deformity.  Left knee and ankle with full ROM. No calf tenderness, redness or mass. Varicose veins noted distal to knee. No redness.   Neurological: She is alert and oriented to person, place, and time.  Skin: Skin is warm and dry. She is not diaphoretic.  Psychiatric: She has a normal mood and affect. Her behavior is normal. Judgment and thought content normal.  Vitals reviewed.     BP 112/72   Pulse 72   Temp 98.2 F (36.8 C) (Oral)   Ht 5\' 8"  (1.727 m)   Wt 190 lb 12.8 oz (  86.5 kg)   SpO2 98%   BMI 29.01 kg/m  Wt Readings from Last 3 Encounters:  08/10/16 190 lb 12.8 oz (86.5 kg)  07/26/16 190 lb (86.2 kg)  07/20/16 188 lb 12 oz (85.6 kg)       Assessment & Plan:  1. Need for influenza vaccination - Flu Vaccine QUAD 36+ mos IM  2. Pain in left shin - suspect from over use and dropping to ground from elevated surface repeatedly - continue otc NSAIDS, add heat/ice prn, gentle ROM TID - wear compression/support stockings prn - RTC precautions reviwed   Clarene Reamer, FNP-BC  [facility], [place]  08/10/2016 1:04 PM

## 2016-08-22 DIAGNOSIS — H43813 Vitreous degeneration, bilateral: Secondary | ICD-10-CM | POA: Diagnosis not present

## 2016-08-28 ENCOUNTER — Other Ambulatory Visit: Payer: Self-pay

## 2016-08-28 MED ORDER — SPIRONOLACTONE-HCTZ 25-25 MG PO TABS: 1.0000 | ORAL_TABLET | Freq: Every day | ORAL | 3 refills | Status: DC

## 2016-08-28 MED ORDER — LEVOTHYROXINE SODIUM 100 MCG PO TABS: ORAL_TABLET | ORAL | 3 refills | Status: DC

## 2016-08-28 NOTE — Telephone Encounter (Signed)
Pt request spironolactone HCTZ and [facility]othyroxine to [facility]. Pt seen 07/26/16 refills done per protocol and pt voiced understanding.

## 2016-09-13 ENCOUNTER — Ambulatory Visit
Admission: RE | Admit: 2016-09-13 | Discharge: 2016-09-13 | Disposition: A | Discharge status: Home / Self Care | Payer: Medicare Other | Source: Ambulatory Visit | Attending: Family Medicine | Admitting: Family Medicine

## 2016-09-13 DIAGNOSIS — Z1231 Encounter for screening mammogram for malignant neoplasm of breast: Secondary | ICD-10-CM | POA: Diagnosis not present

## 2016-10-16 DIAGNOSIS — M272 Inflammatory conditions of jaws: Secondary | ICD-10-CM | POA: Diagnosis not present

## 2016-10-22 DIAGNOSIS — I781 Nevus, non-neoplastic: Secondary | ICD-10-CM | POA: Diagnosis not present

## 2016-10-22 DIAGNOSIS — Z1283 Encounter for screening for malignant neoplasm of skin: Secondary | ICD-10-CM | POA: Diagnosis not present

## 2016-10-22 DIAGNOSIS — D2222 Melanocytic nevi of left ear and external auricular canal: Secondary | ICD-10-CM | POA: Diagnosis not present

## 2016-10-22 DIAGNOSIS — I8393 Asymptomatic varicose veins of bilateral lower extremities: Secondary | ICD-10-CM | POA: Diagnosis not present

## 2016-10-22 DIAGNOSIS — L821 Other seborrheic keratosis: Secondary | ICD-10-CM | POA: Diagnosis not present

## 2016-10-22 DIAGNOSIS — D229 Melanocytic nevi, unspecified: Secondary | ICD-10-CM | POA: Diagnosis not present

## 2016-10-22 DIAGNOSIS — R21 Rash and other nonspecific skin eruption: Secondary | ICD-10-CM | POA: Diagnosis not present

## 2016-10-22 DIAGNOSIS — D1800 Hemangioma unspecified site: Secondary | ICD-10-CM | POA: Diagnosis not present

## 2016-10-22 DIAGNOSIS — L578 Other skin changes due to chronic exposure to nonionizing radiation: Secondary | ICD-10-CM | POA: Diagnosis not present

## 2016-10-22 DIAGNOSIS — D485 Neoplasm of uncertain behavior of skin: Secondary | ICD-10-CM | POA: Diagnosis not present

## 2016-10-22 DIAGNOSIS — L905 Scar conditions and fibrosis of skin: Secondary | ICD-10-CM | POA: Diagnosis not present

## 2016-10-23 ENCOUNTER — Other Ambulatory Visit: Payer: Self-pay | Admitting: *Deleted

## 2016-10-23 MED ORDER — EZETIMIBE-SIMVASTATIN 10-20 MG PO TABS: 1.0000 | ORAL_TABLET | Freq: Every day | ORAL | 2 refills | Status: DC

## 2016-12-18 DIAGNOSIS — D1801 Hemangioma of skin and subcutaneous tissue: Secondary | ICD-10-CM | POA: Diagnosis not present

## 2016-12-18 DIAGNOSIS — I788 Other diseases of capillaries: Secondary | ICD-10-CM | POA: Diagnosis not present

## 2017-01-30 DIAGNOSIS — D229 Melanocytic nevi, unspecified: Secondary | ICD-10-CM | POA: Diagnosis not present

## 2017-01-30 DIAGNOSIS — R202 Paresthesia of skin: Secondary | ICD-10-CM | POA: Diagnosis not present

## 2017-01-30 DIAGNOSIS — I788 Other diseases of capillaries: Secondary | ICD-10-CM | POA: Diagnosis not present

## 2017-01-30 DIAGNOSIS — L821 Other seborrheic keratosis: Secondary | ICD-10-CM | POA: Diagnosis not present

## 2017-05-01 ENCOUNTER — Telehealth: Payer: Self-pay | Admitting: *Deleted

## 2017-05-01 DIAGNOSIS — E8940 Asymptomatic postprocedural ovarian failure: Secondary | ICD-10-CM

## 2017-05-01 DIAGNOSIS — N898 Other specified noninflammatory disorders of vagina: Secondary | ICD-10-CM

## 2017-05-01 MED ORDER — ESTRADIOL 10 MCG VA TABS: ORAL_TABLET | VAGINAL | 6 refills | Status: DC

## 2017-05-01 NOTE — Telephone Encounter (Signed)
Received rx refill request for Vagifem from the pharmacy.  Renewal sent to the pharmacy per Dr Hulan Fray order.

## 2017-05-17 ENCOUNTER — Encounter: Payer: Self-pay | Admitting: Primary Care

## 2017-05-17 ENCOUNTER — Ambulatory Visit (INDEPENDENT_AMBULATORY_CARE_PROVIDER_SITE_OTHER): Payer: Medicare Other | Admitting: Primary Care

## 2017-05-17 VITALS — BP 120/84 | HR 59 | Temp 97.8°F | Ht 68.0 in | Wt 191.0 lb

## 2017-05-17 DIAGNOSIS — M436 Torticollis: Secondary | ICD-10-CM

## 2017-05-17 NOTE — Patient Instructions (Signed)
If you do find a tick on your skin, carefully remove it with tweezers and ensure you get the entire body.  Take a look at the information below.  You do not have evidence of a tick bite or Lyme Disease.  It was a pleasure meeting you!   Tick Bite Information, Adult Ticks are insects that draw blood for food. Most ticks live in shrubs and grassy areas. They climb onto people and animals that brush against the leaves and grasses that they rest on. Then they bite, attaching themselves to the skin. Most ticks are harmless, but some ticks carry germs that can spread to a person through a bite and cause a disease. To reduce your risk of getting a disease from a tick bite, it is important to take steps to prevent tick bites. It is also important to check for ticks after being outdoors. If you find that a tick has attached to you, watch for symptoms of disease. How can I prevent tick bites? Take these steps to help prevent tick bites when you are outdoors in an area where ticks are found:  Use insect repellent that has DEET (20% or higher), picaridin, or IR3535 in it. Use it on: ? Skin that is showing. ? The top of your boots. ? Your pant legs. ? Your sleeve cuffs.  For repellent products that contain permethrin, follow product instructions. Use these products on: ? Clothing. ? Gear. ? Boots. ? Tents.  Wear protective clothing. Long sleeves and long pants offer the best protection from ticks.  Wear light-colored clothing so you can see ticks more easily.  Tuck your pant legs into your socks.  If you go walking on a trail, stay in the middle of the trail so your skin, hair, and clothing do not touch the bushes.  Avoid walking through areas with long grass.  Check for ticks on your clothing, hair, and skin often while you are outside, and check again before you go inside. Make sure to check the places that ticks attach themselves most often. These places include the scalp, neck, armpits,  waist, groin, and joint areas. Ticks that carry a disease called Lyme disease have to be attached to the skin for 24-48 hours. Checking for ticks every day will lessen your risk of this and other diseases.  When you come indoors, wash your clothes and take a shower or a bath right away. Dry your clothes in a dryer on high heat for at least 60 minutes. This will kill any ticks in your clothes.  What is the proper way to remove a tick? If you find a tick on your body, remove it as soon as possible. Removing a tick sooner rather than later can prevent germs from passing from the tick to your body. To remove a tick that is crawling on your skin but has not bitten:  Go outdoors and brush the tick off.  Remove the tick with tape or a lint roller.  To remove a tick that is attached to your skin:  Wash your hands.  If you have latex gloves, put them on.  Use tweezers, curved forceps, or a tick-removal tool to gently grasp the tick as close to your skin and the tick's head as possible.  Gently pull with steady, upward pressure until the tick lets go. When removing the tick: ? Take care to keep the tick's head attached to its body. ? Do not twist or jerk the tick. This can make the tick's  head or mouth break off. ? Do not squeeze or crush the tick's body. This could force disease-carrying fluids from the tick into your body.  Do not try to remove a tick with heat, alcohol, petroleum jelly, or fingernail polish. Using these methods can cause the tick to salivate and regurgitate into your bloodstream, increasing your risk of getting a disease. What should I do after removing a tick?  Clean the bite area with soap and water, rubbing alcohol, or an iodine scrub.  If an antiseptic cream or ointment is available, apply a small amount to the bite site.  Wash and disinfect any instruments that you used to remove the tick. How should I dispose of a tick? To dispose of a live tick, use one of these  methods:  Place it in rubbing alcohol.  Place it in a sealed bag or container.  Wrap it tightly in tape.  Flush it down the toilet.  Contact a health care provider if:  You have symptoms of a disease after a tick bite. Symptoms of a tick-borne disease can occur from moments after the tick bites to up to 30 days after a tick is removed. Symptoms include: ? Muscle, joint, or bone pain. ? Difficulty walking or moving your legs. ? Numbness in the legs. ? Paralysis. ? Red rash around the tick bite area that is shaped like a target or a "bull's-eye." ? Redness and swelling in the area of the tick bite. ? Fever. ? Repeated vomiting. ? Diarrhea. ? Weight loss. ? Tender, swollen lymph glands. ? Shortness of breath. ? Cough. ? Pain in the abdomen. ? Headache. ? Abnormal tiredness. ? A change in your level of consciousness. ? Confusion. Get help right away if:  You are not able to remove a tick.  A part of a tick breaks off and gets stuck in your skin.  Your symptoms get worse. Summary  Ticks may carry germs that can spread to a person through a bite and cause disease.  Wear protective clothing and use insect repellent to prevent tick bites. Follow product instructions.  If you find a tick on your body, remove it as soon as possible. If the tick is attached, do not try to remove with heat, alcohol, petroleum jelly, or fingernail polish.  Remove the attached tick using tweezers, curved forceps, or a tick-removal tool. Gently pull with steady, upward pressure until the tick lets go. Do not twist or jerk the tick. Do not squeeze or crush the tick's body.  If you have symptoms after being bitten by a tick, contact a health care provider. This information is not intended to replace advice given to you by your health care provider. Make sure you discuss any questions you have with your health care provider. Document Released: 11/16/2000 Document Revised: 08/31/2016 Document Reviewed:  08/31/2016 Elsevier Interactive Patient Education  [employer].

## 2017-05-17 NOTE — Progress Notes (Signed)
Subjective:    Patient ID: Susan Byrd, female    DOB: [birth year], 71 y.o.   MRN: [id number]  HPI  Ms. Florene Glen is a 71 year old female with a history of chronic neck pain who presents today with a chief complaint of neck stiffness. Her stiffness has been present for years, with recent bout over the past 2 months. She's been seeing a chiropractor for the past several months. She noticed a scab to the back of her left lower neck 1 week ago. Over the past 6 weeks she's noticed a different type of pain/stiffness to the base of her neck.   Tuesday this week she noticed a loose spot in her hair that resembled a tick. She brought the loose spot today and would like to ensure it wasn't a tick.  She has been working out in the garden often. She denies headaches, fevers, rash, body aches.  Review of Systems  Constitutional: Negative for fatigue and fever.  Musculoskeletal: Positive for neck pain and neck stiffness. Negative for arthralgias.  Skin: Negative for color change and rash.  Neurological: Negative for dizziness, weakness and headaches.       Past Medical History:  Diagnosis Date  . GERD (gastroesophageal reflux disease)   . High cholesterol   . Hyperlipidemia   . Hypertension   . Interdigital neuroma 07/29/2013   3RD IDS LEFT FOOT  . Migraine    rare  . Motion sickness    reading in car  . Thyroid disease      Social History   Social History  . Marital status: Married    Spouse name: N/A  . Number of children: N/A  . Years of education: N/A   Occupational History  . Retired Retired   Social History Main Topics  . Smoking status: Never Smoker  . Smokeless tobacco: Never Used  . Alcohol use Yes     Comment: 1-2 PER WEEK  . Drug use: No  . Sexual activity: No     Comment: Husband decease   Other Topics Concern  . Not on file   Social History Narrative   Widowed 2014   Does have a living will.   Desires CPR- would not want prolonged life support if  futile.    Past Surgical History:  Procedure Laterality Date  . ABDOMINAL HYSTERECTOMY    . BUNIONECTOMY    . CATARACT EXTRACTION W/PHACO Left 12/19/2015   Procedure: CATARACT EXTRACTION PHACO AND INTRAOCULAR LENS PLACEMENT (IOC);  Surgeon: Ronnell Freshwater, MD;  Location: [facility];  Service: Ophthalmology;  Laterality: Left;  . CATARACT EXTRACTION W/PHACO Right 01/09/2016   Procedure: CATARACT EXTRACTION PHACO AND INTRAOCULAR LENS PLACEMENT (IOC);  Surgeon: Ronnell Freshwater, MD;  Location: [place];  Service: Ophthalmology;  Laterality: Right;  . CHOLECYSTECTOMY    . COLONOSCOPY    . EYE MUSCLE SURGERY Left 06/28/2016   strabismus correction    Family History  Problem Relation Age of Onset  . Cancer Mother        bone  . Stroke Father   . Heart disease Sister   . Hypertension Sister   . Heart disease Brother   . Hypertension Brother     Allergies  Allergen Reactions  . Amoxicillin Rash       . Penicillins Rash       . Sulfonamide Derivatives Rash         Current Outpatient Prescriptions on File Prior to Visit  Medication Sig  Dispense Refill  . Estradiol 10 MCG TABS vaginal tablet Place one tablet vaginally three times per week 8 tablet 6  . ezetimibe-simvastatin (VYTORIN) 10-20 MG tablet Take 1 tablet by mouth daily. 90 tablet 2  . latanoprost (XALATAN) 0.005 % ophthalmic solution Place 1 drop into both eyes at bedtime.    Marland Kitchen levothyroxine (SYNTHROID, LEVOTHROID) 100 MCG tablet TAKE 1 TABLET BY MOUTH DAILY. 90 tablet 3  . spironolactone-hydrochlorothiazide (ALDACTAZIDE) 25-25 MG tablet Take 1 tablet by mouth daily. 90 tablet 3   No current facility-administered medications on file prior to visit.     BP 120/84   Pulse (!) 59   Temp 97.8 F (36.6 C) (Oral)   Ht 5\' 8"  (1.727 m)   Wt 191 lb (86.6 kg)   SpO2 97%   BMI 29.04 kg/m    Objective:   Physical Exam  Constitutional: She appears well-nourished.  Neck: Neck supple.    General mild decrease in ROM that is chronic. No new decrease in ROM.  Cardiovascular: Normal rate and regular rhythm.   Pulmonary/Chest: Effort normal and breath sounds normal.  Skin: Skin is warm and dry. No rash noted.          Assessment & Plan:  Chronic Neck Pain:  Different type of stiffness/neck pain over past 6 weeks. Working with Restaurant manager, fast food. Exam today without evidence of Lyme Disease, meningitis, deformity. Suspect MSK/arthritis. Spot that was brought in today was observed under a microscope and is a scab. No tick. Information provided regarding tick bites and Lyme disease as this was her major concern today.  Sheral Flow, NP

## 2017-05-21 ENCOUNTER — Other Ambulatory Visit: Payer: Self-pay

## 2017-05-21 MED ORDER — LEVOTHYROXINE SODIUM 100 MCG PO TABS: ORAL_TABLET | ORAL | 0 refills | Status: DC

## 2017-06-10 DIAGNOSIS — H402231 Chronic angle-closure glaucoma, bilateral, mild stage: Secondary | ICD-10-CM | POA: Diagnosis not present

## 2017-06-12 ENCOUNTER — Other Ambulatory Visit: Payer: Self-pay

## 2017-06-12 MED ORDER — SPIRONOLACTONE-HCTZ 25-25 MG PO TABS: 1.0000 | ORAL_TABLET | Freq: Every day | ORAL | 0 refills | Status: DC

## 2017-08-21 ENCOUNTER — Encounter: Payer: Self-pay | Admitting: Obstetrics & Gynecology

## 2017-08-21 ENCOUNTER — Ambulatory Visit (INDEPENDENT_AMBULATORY_CARE_PROVIDER_SITE_OTHER): Payer: Medicare Other | Admitting: Obstetrics & Gynecology

## 2017-08-21 VITALS — BP 104/68 | HR 83 | Ht 67.5 in | Wt 185.0 lb

## 2017-08-21 DIAGNOSIS — Z23 Encounter for immunization: Secondary | ICD-10-CM | POA: Diagnosis not present

## 2017-08-21 DIAGNOSIS — Z01419 Encounter for gynecological examination (general) (routine) without abnormal findings: Secondary | ICD-10-CM | POA: Diagnosis not present

## 2017-08-21 DIAGNOSIS — N898 Other specified noninflammatory disorders of vagina: Secondary | ICD-10-CM

## 2017-08-21 MED ORDER — ESTRADIOL 10 MCG VA TABS: ORAL_TABLET | VAGINAL | 6 refills | Status: DC

## 2017-08-21 NOTE — Progress Notes (Signed)
Subjective:    Susan Byrd is a 71 y.o. [place] P0  female who presents for an annual exam. The patient has no complaints today. She needs a refill of vagifem.The patient is not currently sexually active. GYN screening history: last pap: was normal. The patient wears seatbelts: yes. The patient participates in regular exercise: yes. Has the patient ever been transfused or tattooed?: no. The patient reports that there is not domestic violence in her life.   Menstrual History: OB History    Gravida Para Term Preterm AB Living   1 0 0 0 1 0   SAB TAB Ectopic Multiple Live Births   1 0 0 0 0      Menarche age: 25 No LMP recorded. Patient has had a hysterectomy.    The following portions of the patient's history were reviewed and updated as appropriate: allergies, current medications, past family history, past medical history, past social history, past surgical history and problem list.  Review of Systems Pertinent items are noted in HPI.   Her husband died about 6 years ago. She is moving to [facility] by the end of this year.    Objective:    BP 104/68   Pulse 83   Ht 5' 7.5" (1.715 m)   Wt 185 lb (83.9 kg)   BMI 28.55 kg/m   General Appearance:    Alert, cooperative, no distress, appears stated age  Head:    Normocephalic, without obvious abnormality, atraumatic  Eyes:    PERRL, conjunctiva/corneas clear, EOM's intact, fundi    benign, both eyes  Ears:    Normal TM's and external ear canals, both ears  Nose:   Nares normal, septum midline, mucosa normal, no drainage    or sinus tenderness  Throat:   Lips, mucosa, and tongue normal; teeth and gums normal  Neck:   Supple, symmetrical, trachea midline, no adenopathy;    thyroid:  no enlargement/tenderness/nodules; no carotid   bruit or JVD  Back:     Symmetric, no curvature, ROM normal, no CVA tenderness  Lungs:     Clear to auscultation bilaterally, respirations unlabored  Chest Wall:    No tenderness or deformity   Heart:     Regular rate and rhythm, S1 and S2 normal, no murmur, rub   or gallop  Breast Exam:    No tenderness, masses, or nipple abnormality  Abdomen:     Soft, non-tender, bowel sounds active all four quadrants,    no masses, no organomegaly  Genitalia:    Normal female without lesion, discharge or tenderness     Extremities:   Extremities normal, atraumatic, no cyanosis or edema  Pulses:   2+ and symmetric all extremities  Skin:   Skin color, texture, turgor normal, no rashes or lesions  Lymph nodes:   Cervical, supraclavicular, and axillary nodes normal  Neurologic:   CNII-XII intact, normal strength, sensation and reflexes    throughout  .    Assessment:    Healthy female exam.    Plan:     Mammogram.   Flu vaccine today

## 2017-08-27 ENCOUNTER — Ambulatory Visit (INDEPENDENT_AMBULATORY_CARE_PROVIDER_SITE_OTHER): Payer: Medicare Other

## 2017-08-27 VITALS — BP 122/80 | HR 65 | Temp 97.9°F | Ht 68.0 in | Wt 186.0 lb

## 2017-08-27 DIAGNOSIS — E038 Other specified hypothyroidism: Secondary | ICD-10-CM

## 2017-08-27 DIAGNOSIS — E559 Vitamin D deficiency, unspecified: Secondary | ICD-10-CM

## 2017-08-27 DIAGNOSIS — E7849 Other hyperlipidemia: Secondary | ICD-10-CM

## 2017-08-27 DIAGNOSIS — Z0000 Encounter for general adult medical examination without abnormal findings: Secondary | ICD-10-CM

## 2017-08-27 DIAGNOSIS — E784 Other hyperlipidemia: Secondary | ICD-10-CM | POA: Diagnosis not present

## 2017-08-27 DIAGNOSIS — I10 Essential (primary) hypertension: Secondary | ICD-10-CM

## 2017-08-27 LAB — CBC WITH DIFFERENTIAL/PLATELET
BASOS ABS: 0 10*3/uL (ref 0.0–0.1)
BASOS PCT: 0.6 % (ref 0.0–3.0)
EOS ABS: 0.1 10*3/uL (ref 0.0–0.7)
Eosinophils Relative: 0.9 % (ref 0.0–5.0)
HCT: 43.9 % (ref 36.0–46.0)
Hemoglobin: 14.6 g/dL (ref 12.0–15.0)
LYMPHS ABS: 1.7 10*3/uL (ref 0.7–4.0)
Lymphocytes Relative: 27.1 % (ref 12.0–46.0)
MCHC: 33.2 g/dL (ref 30.0–36.0)
MCV: 90 fl (ref 78.0–100.0)
MONOS PCT: 9.4 % (ref 3.0–12.0)
Monocytes Absolute: 0.6 10*3/uL (ref 0.1–1.0)
NEUTROS ABS: 3.9 10*3/uL (ref 1.4–7.7)
NEUTROS PCT: 62 % (ref 43.0–77.0)
PLATELETS: 241 10*3/uL (ref 150.0–400.0)
RBC: 4.88 Mil/uL (ref 3.87–5.11)
RDW: 14.3 % (ref 11.5–15.5)
WBC: 6.2 10*3/uL (ref 4.0–10.5)

## 2017-08-27 LAB — LIPID PANEL
CHOLESTEROL: 131 mg/dL (ref 0–200)
HDL: 45.7 mg/dL (ref >39.00)
LDL CALC: 58 mg/dL (ref 0–99)
NONHDL: 85.1
Total CHOL/HDL Ratio: 3
Triglycerides: 138 mg/dL (ref 0.0–149.0)
VLDL: 27.6 mg/dL (ref 0.0–40.0)

## 2017-08-27 LAB — COMPREHENSIVE METABOLIC PANEL
ALBUMIN: 4.2 g/dL (ref 3.5–5.2)
ALT: 21 U/L (ref 0–35)
AST: 21 U/L (ref 0–37)
Alkaline Phosphatase: 53 U/L (ref 39–117)
BILIRUBIN TOTAL: 0.6 mg/dL (ref 0.2–1.2)
BUN: 18 mg/dL (ref 6–23)
CALCIUM: 9.7 mg/dL (ref 8.4–10.5)
CHLORIDE: 101 meq/L (ref 96–112)
CO2: 33 mEq/L — ABNORMAL HIGH (ref 19–32)
CREATININE: 0.86 mg/dL (ref 0.40–1.20)
GFR: 69.05 mL/min (ref >60.00)
Glucose, Bld: 102 mg/dL — ABNORMAL HIGH (ref 70–99)
POTASSIUM: 4.3 meq/L (ref 3.5–5.1)
Sodium: 140 mEq/L (ref 135–145)
Total Protein: 7.2 g/dL (ref 6.0–8.3)

## 2017-08-27 LAB — TSH: TSH: 0.82 u[IU]/mL (ref 0.35–4.50)

## 2017-08-27 LAB — T4, FREE: Free T4: 1.17 ng/dL (ref 0.60–1.60)

## 2017-08-27 LAB — VITAMIN D 25 HYDROXY (VIT D DEFICIENCY, FRACTURES): VITD: 41.23 ng/mL (ref 30.00–100.00)

## 2017-08-27 NOTE — Progress Notes (Signed)
PCP notes:   Health maintenance:  No gaps identified.  Abnormal screenings:   None  Patient concerns:   None  Nurse concerns:  None  Next PCP appt:   09/03/17 @ 0915

## 2017-08-27 NOTE — Progress Notes (Signed)
Subjective:   Susan Byrd is a 71 y.o. female who presents for Medicare Annual (Subsequent) preventive examination.  Review of Systems:  N/A Cardiac Risk Factors include: advanced age (>73men, >29 women);dyslipidemia;hypertension     Objective:     Vitals: BP 122/80 (BP Location: Right Arm, Patient Position: Sitting, Cuff Size: Normal)   Pulse 65   Temp 97.9 F (36.6 C) (Oral)   Ht 5\' 8"  (1.727 m)   Wt 186 lb (84.4 kg)   SpO2 98%   BMI 28.28 kg/m   Body mass index is 28.28 kg/m.   Tobacco History  Smoking Status  . Never Smoker  Smokeless Tobacco  . Never Used     Counseling given: No   Past Medical History:  Diagnosis Date  . GERD (gastroesophageal reflux disease)   . High cholesterol   . Hyperlipidemia   . Hypertension   . Interdigital neuroma 07/29/2013   3RD IDS LEFT FOOT  . Migraine    rare  . Motion sickness    reading in car  . Thyroid disease    Past Surgical History:  Procedure Laterality Date  . ABDOMINAL HYSTERECTOMY    . BUNIONECTOMY    . CATARACT EXTRACTION W/PHACO Left 12/19/2015   Procedure: CATARACT EXTRACTION PHACO AND INTRAOCULAR LENS PLACEMENT (IOC);  Surgeon: Ronnell Freshwater, MD;  Location: [place];  Service: Ophthalmology;  Laterality: Left;  . CATARACT EXTRACTION W/PHACO Right 01/09/2016   Procedure: CATARACT EXTRACTION PHACO AND INTRAOCULAR LENS PLACEMENT (IOC);  Surgeon: Ronnell Freshwater, MD;  Location: [place];  Service: Ophthalmology;  Laterality: Right;  . CHOLECYSTECTOMY    . COLONOSCOPY    . EYE MUSCLE SURGERY Left 06/28/2016   strabismus correction   Family History  Problem Relation Age of Onset  . Cancer Mother        bone  . Stroke Father   . Heart disease Sister   . Hypertension Sister   . Heart disease Brother   . Hypertension Brother   . Leukemia Brother    History  Sexual Activity  . Sexual activity: No    Comment: Husband decease    Outpatient Encounter  Prescriptions as of 08/27/2017  Medication Sig  . Estradiol 10 MCG TABS vaginal tablet Place one tablet vaginally three times per week  . ezetimibe-simvastatin (VYTORIN) 10-20 MG tablet Take 1 tablet by mouth daily.  Marland Kitchen latanoprost (XALATAN) 0.005 % ophthalmic solution Place 1 drop into both eyes at bedtime.  Marland Kitchen levothyroxine (SYNTHROID, LEVOTHROID) 100 MCG tablet TAKE 1 TABLET BY MOUTH DAILY.  Marland Kitchen spironolactone-hydrochlorothiazide (ALDACTAZIDE) 25-25 MG tablet Take 1 tablet by mouth daily. Needs appt for further refills. Please call our office to make an appt.   No facility-administered encounter medications on file as of 08/27/2017.     Activities of Daily Living In your present state of health, do you have any difficulty performing the following activities: 08/27/2017  Hearing? N  Vision? N  Difficulty concentrating or making decisions? N  Walking or climbing stairs? N  Dressing or bathing? N  Doing errands, shopping? N  Preparing Food and eating ? N  Using the Toilet? N  In the past six months, have you accidently leaked urine? Y  Do you have problems with loss of bowel control? N  Managing your Medications? N  Managing your Finances? N  Housekeeping or managing your Housekeeping? N  Some recent data might be hidden    Patient Care Team: Lucille Passy, MD as  PCP - General Emily Filbert, MD as Consulting Physician (Obstetrics and Gynecology) Philis Kendall, MD as Orthopedic Physician Assistant - Certified (Unknown Physician Specialty) Garrel Ridgel, DPM as Consulting Physician (Podiatry) Vin-Parikh, Deirdre Peer, MD as Referring Physician (Ophthalmology) Bethann Goo, MD as Referring Physician (Ophthalmology)    Assessment:     Hearing Screening   125Hz  250Hz  500Hz  1000Hz  2000Hz  3000Hz  4000Hz  6000Hz  8000Hz   Right ear:   40 40 40  40    Left ear:   40 40 40  40    Vision Screening Comments: Last vision exam in July 2018   Exercise Activities and Dietary  recommendations Current Exercise Habits: Home exercise routine, Type of exercise: walking, Time (Minutes): 30, Frequency (Times/Week): 3, Weekly Exercise (Minutes/Week): 90, Intensity: Moderate, Exercise limited by: None identified  Goals    . Increase physical activity          Starting 08/27/2017, I will continue to exercise for at least 30 min 2-3 days per week.       Fall Risk Fall Risk  08/27/2017 07/20/2016 07/26/2015 07/07/2014  Falls in the past year? No No No No   Depression Screen PHQ 2/9 Scores 08/27/2017 07/20/2016 07/26/2015 07/07/2014  PHQ - 2 Score 0 0 0 0  PHQ- 9 Score 0 - - -     Cognitive Function MMSE - Mini Mental State Exam 08/27/2017 07/20/2016  Orientation to time 5 5  Orientation to Place 5 5  Registration 3 3  Attention/ Calculation 0 0  Recall 3 3  Language- name 2 objects 0 0  Language- repeat 1 1  Language- follow 3 step command 3 3  Language- read & follow direction 0 0  Write a sentence 0 0  Copy design 0 0  Total score 20 20        Immunization History  Administered Date(s) Administered  . Hep A / Hep B 11/19/2012, 11/28/2012, 12/24/2012  . Influenza Split 09/12/2011, 11/04/2012  . Influenza Whole 09/14/2010  . Influenza,inj,Quad PF,6+ Mos 11/19/2013, 09/23/2014, 07/26/2015, 08/10/2016, 08/21/2017  . Pneumococcal Conjugate-13 07/07/2014  . Pneumococcal Polysaccharide-23 02/26/2012  . Td 02/26/2012  . Zoster 09/14/2010   Screening Tests Health Maintenance  Topic Date Due  . MAMMOGRAM  09/13/2017  . COLONOSCOPY  03/07/2021  . TETANUS/TDAP  02/25/2022  . INFLUENZA VACCINE  Completed  . DEXA SCAN  Completed  . Hepatitis C Screening  Completed  . PNA vac Low Risk Adult  Completed      Plan:     I have personally reviewed and addressed the Medicare Annual Wellness questionnaire and have noted the following in the patient's chart:  A. Medical and social history B. Use of alcohol, tobacco or illicit drugs  C. Current medications and  supplements D. Functional ability and status E.  Nutritional status F.  Physical activity G. Advance directives H. List of other physicians I.  Hospitalizations, surgeries, and ER visits in previous 12 months J.  [place] to include hearing, vision, cognitive, depression L. Referrals and appointments - none  In addition, I have reviewed and discussed with patient certain preventive protocols, quality metrics, and best practice recommendations. A written personalized care plan for preventive services as well as general preventive health recommendations were provided to patient.  See attached scanned questionnaire for additional information.   Signed,   Lindell Noe, MHA, BS, LPN Health Coach

## 2017-08-27 NOTE — Progress Notes (Signed)
Pre visit review using our clinic review tool, if applicable. No additional management support is needed unless otherwise documented below in the visit note. 

## 2017-08-27 NOTE — Patient Instructions (Signed)
Susan Byrd , Thank you for taking time to come for your Medicare Wellness Visit. I appreciate your ongoing commitment to your health goals. Please review the following plan we discussed and let me know if I can assist you in the future.   These are the goals we discussed: Goals    . Increase physical activity          Starting 08/27/2017, I will continue to exercise for at least 30 min 2-3 days per week.        This is a list of the screening recommended for you and due dates:  Health Maintenance  Topic Date Due  . Mammogram  09/13/2017  . Colon Cancer Screening  03/07/2021  . Tetanus Vaccine  02/25/2022  . Flu Shot  Completed  . DEXA scan (bone density measurement)  Completed  .  Hepatitis C: One time screening is recommended by Center for Disease Control  (CDC) for  adults born from [birth year] through 1965.   Completed  . Pneumonia vaccines  Completed   Preventive Care for Adults  A healthy lifestyle and preventive care can promote health and wellness. Preventive health guidelines for adults include the following key practices.  . A routine yearly physical is a good way to check with your health care provider about your health and preventive screening. It is a chance to share any concerns and updates on your health and to receive a thorough exam.  . Visit your dentist for a routine exam and preventive care every 6 months. Brush your teeth twice a day and floss once a day. Good oral hygiene prevents tooth decay and gum disease.  . The frequency of eye exams is based on your age, health, family medical history, use  of contact lenses, and other factors. Follow your health care provider's ecommendations for frequency of eye exams.  . Eat a healthy diet. Foods like vegetables, fruits, whole grains, low-fat dairy products, and lean protein foods contain the nutrients you need without too many calories. Decrease your intake of foods high in solid fats, added sugars, and salt. Eat the right  amount of calories for you. Get information about a proper diet from your health care provider, if necessary.  . Regular physical exercise is one of the most important things you can do for your health. Most adults should get at least 150 minutes of moderate-intensity exercise (any activity that increases your heart rate and causes you to sweat) each week. In addition, most adults need muscle-strengthening exercises on 2 or more days a week.  Silver Sneakers may be a benefit available to you. To determine eligibility, you may visit the website: www.silversneakers.com or contact program at 8642427073 Mon-Fri between 8AM-8PM.   . Maintain a healthy weight. The body mass index (BMI) is a screening tool to identify possible weight problems. It provides an estimate of body fat based on height and weight. Your health care provider can find your BMI and can help you achieve or maintain a healthy weight.   For adults 20 years and older: ? A BMI below 18.5 is considered underweight. ? A BMI of 18.5 to 24.9 is normal. ? A BMI of 25 to 29.9 is considered overweight. ? A BMI of 30 and above is considered obese.   . Maintain normal blood lipids and cholesterol levels by exercising and minimizing your intake of saturated fat. Eat a balanced diet with plenty of fruit and vegetables. Blood tests for lipids and cholesterol should begin at age  20 and be repeated every 5 years. If your lipid or cholesterol levels are high, you are over 50, or you are at high risk for heart disease, you may need your cholesterol levels checked more frequently. Ongoing high lipid and cholesterol levels should be treated with medicines if diet and exercise are not working.  . If you smoke, find out from your health care provider how to quit. If you do not use tobacco, please do not start.  . If you choose to drink alcohol, please do not consume more than 2 drinks per day. One drink is considered to be 12 ounces (355 mL) of beer, 5  ounces (148 mL) of wine, or 1.5 ounces (44 mL) of liquor.  . If you are 8-53 years old, ask your health care provider if you should take aspirin to prevent strokes.  . Use sunscreen. Apply sunscreen liberally and repeatedly throughout the day. You should seek shade when your shadow is shorter than you. Protect yourself by wearing long sleeves, pants, a wide-brimmed hat, and sunglasses year round, whenever you are outdoors.  . Once a month, do a whole body skin exam, using a mirror to look at the skin on your back. Tell your health care provider of new moles, moles that have irregular borders, moles that are larger than a pencil eraser, or moles that have changed in shape or color.

## 2017-08-28 NOTE — Progress Notes (Signed)
I reviewed health advisor's note, was available for consultation, and agree with documentation and plan.  

## 2017-09-03 ENCOUNTER — Ambulatory Visit (INDEPENDENT_AMBULATORY_CARE_PROVIDER_SITE_OTHER): Payer: Medicare Other | Admitting: Family Medicine

## 2017-09-03 ENCOUNTER — Encounter: Payer: Self-pay | Admitting: Family Medicine

## 2017-09-03 VITALS — BP 110/80 | HR 68 | Ht 67.5 in | Wt 189.0 lb

## 2017-09-03 DIAGNOSIS — I10 Essential (primary) hypertension: Secondary | ICD-10-CM | POA: Diagnosis not present

## 2017-09-03 DIAGNOSIS — E038 Other specified hypothyroidism: Secondary | ICD-10-CM | POA: Diagnosis not present

## 2017-09-03 DIAGNOSIS — E785 Hyperlipidemia, unspecified: Secondary | ICD-10-CM

## 2017-09-03 MED ORDER — SPIRONOLACTONE-HCTZ 25-25 MG PO TABS: 1.0000 | ORAL_TABLET | Freq: Every day | ORAL | 3 refills | Status: AC

## 2017-09-03 MED ORDER — LEVOTHYROXINE SODIUM 100 MCG PO TABS: ORAL_TABLET | ORAL | 3 refills | Status: AC

## 2017-09-03 MED ORDER — EZETIMIBE-SIMVASTATIN 10-20 MG PO TABS: 1.0000 | ORAL_TABLET | Freq: Every day | ORAL | 3 refills | Status: AC

## 2017-09-03 NOTE — Assessment & Plan Note (Signed)
Well controlled. No changes made to rxs. 

## 2017-09-03 NOTE — Patient Instructions (Signed)
Great to see you!  Congratulations!

## 2017-09-03 NOTE — Progress Notes (Signed)
Subjective:   Patient ID: Susan Byrd, female    DOB: Aug 08, 1946, 71 y.o.   MRN: [id number]  Susan Byrd is a pleasant 71 y.o. year old female who presents to clinic today with Follow-up  on 09/03/2017  HPI:  Saw Candis Musa, RN for medicare wellness visit on 08/27/17. Note reviewed.  She is doing really well.  Finally sold her house and is moving to [place].  She has been building her dream home there for two years now.  Has been very physically active- packing up her home.  Hypothyroidism- on Synthroid 100 micrograms daily.  Denies any symptoms of hypo or hyperthyroidism. Wt Readings from Last 3 Encounters:  09/03/17 189 lb (85.7 kg)  08/27/17 186 lb (84.4 kg)  08/21/17 185 lb (83.9 kg)    Lab Results  Component Value Date   TSH 0.82 08/27/2017    HLD- has been on Vytorin 10-20 mg daily.  Well controlled. Lab Results  Component Value Date   CHOL 131 08/27/2017   HDL 45.70 08/27/2017   LDLCALC 58 08/27/2017   TRIG 138.0 08/27/2017   CHOLHDL 3 08/27/2017   Lab Results  Component Value Date   ALT 21 08/27/2017   AST 21 08/27/2017   ALKPHOS 53 08/27/2017   BILITOT 0.6 08/27/2017      HTN- has been on Spironolactone/HCTZ 25/25 daily.  No CP, SOB, LE edema or blurred vision.  Lab Results  Component Value Date   CREATININE 0.86 08/27/2017    Lab Results  Component Value Date   WBC 6.2 08/27/2017   HGB 14.6 08/27/2017   HCT 43.9 08/27/2017   MCV 90.0 08/27/2017   PLT 241.0 08/27/2017     Current Outpatient Prescriptions on File Prior to Visit  Medication Sig Dispense Refill  . Estradiol 10 MCG TABS vaginal tablet Place one tablet vaginally three times per week 8 tablet 6  . ezetimibe-simvastatin (VYTORIN) 10-20 MG tablet Take 1 tablet by mouth daily. 90 tablet 2  . latanoprost (XALATAN) 0.005 % ophthalmic solution Place 1 drop into both eyes at bedtime.    Marland Kitchen levothyroxine (SYNTHROID, LEVOTHROID) 100 MCG tablet TAKE 1 TABLET BY MOUTH  DAILY. 90 tablet 0  . spironolactone-hydrochlorothiazide (ALDACTAZIDE) 25-25 MG tablet Take 1 tablet by mouth daily. Needs appt for further refills. Please call our office to make an appt. 90 tablet 0   No current facility-administered medications on file prior to visit.     Allergies  Allergen Reactions  . Amoxicillin Rash       . Penicillins Rash       . Sulfonamide Derivatives Rash         Past Medical History:  Diagnosis Date  . GERD (gastroesophageal reflux disease)   . High cholesterol   . Hyperlipidemia   . Hypertension   . Interdigital neuroma 07/29/2013   3RD IDS LEFT FOOT  . Migraine    rare  . Motion sickness    reading in car  . Thyroid disease     Past Surgical History:  Procedure Laterality Date  . ABDOMINAL HYSTERECTOMY    . BUNIONECTOMY    . CATARACT EXTRACTION W/PHACO Left 12/19/2015   Procedure: CATARACT EXTRACTION PHACO AND INTRAOCULAR LENS PLACEMENT (IOC);  Surgeon: Ronnell Freshwater, MD;  Location: [place];  Service: Ophthalmology;  Laterality: Left;  . CATARACT EXTRACTION W/PHACO Right 01/09/2016   Procedure: CATARACT EXTRACTION PHACO AND INTRAOCULAR LENS PLACEMENT (IOC);  Surgeon: Ronnell Freshwater, MD;  Location[facility];  Service: Ophthalmology;  Laterality: Right;  . CHOLECYSTECTOMY    . COLONOSCOPY    . EYE MUSCLE SURGERY Left 06/28/2016   strabismus correction    Family History  Problem Relation Age of Onset  . Cancer Mother        bone  . Stroke Father   . Heart disease Sister   . Hypertension Sister   . Heart disease Brother   . Hypertension Brother   . Leukemia Brother     Social History   Social History  . Marital status: Married    Spouse name: N/A  . Number of children: N/A  . Years of education: N/A   Occupational History  . Retired Retired   Social History Main Topics  . Smoking status: Never Smoker  . Smokeless tobacco: Never Used  . Alcohol use Yes     Comment: 1-2 PER WEEK   . Drug use: No  . Sexual activity: No     Comment: Husband decease   Other Topics Concern  . Not on file   Social History Narrative   Widowed 2014   Does have a living will.   Desires CPR- would not want prolonged life support if futile.   The PMH, PSH, Social History, Family History, Medications, and allergies have been reviewed in [facility], and have been updated if relevant.    Review of Systems  Constitutional: Negative.   HENT: Negative.   Eyes: Negative.   Respiratory: Negative.   Cardiovascular: Negative.   Gastrointestinal: Negative.   Endocrine: Negative.   Genitourinary: Negative.   Musculoskeletal: Negative.   Skin: Negative.   Allergic/Immunologic: Negative.   Neurological: Negative.   Hematological: Negative.   Psychiatric/Behavioral: Negative.   All other systems reviewed and are negative.      Wt Readings from Last 3 Encounters:  09/03/17 189 lb (85.7 kg)  08/27/17 186 lb (84.4 kg)  08/21/17 185 lb (83.9 kg)     Objective:    BP 110/80   Pulse 68   Ht 5' 7.5" (1.715 m)   Wt 189 lb (85.7 kg)   SpO2 98%   BMI 29.16 kg/m   Wt Readings from Last 3 Encounters:  09/03/17 189 lb (85.7 kg)  08/27/17 186 lb (84.4 kg)  08/21/17 185 lb (83.9 kg)    Physical Exam  Constitutional: She is oriented to person, place, and time. She appears well-developed and well-nourished. No distress.  HENT:  Head: Normocephalic and atraumatic.  Eyes: Pupils are equal, round, and reactive to light. Conjunctivae are normal.  Neck: Normal range of motion. Neck supple. No thyromegaly present.  Cardiovascular: Normal rate and regular rhythm.   Pulmonary/Chest: Effort normal and breath sounds normal.  Abdominal: Soft. Bowel sounds are normal. She exhibits no distension.  Musculoskeletal: Normal range of motion.  Neurological: She is alert and oriented to person, place, and time. No cranial nerve deficit. Coordination normal.  Skin: Skin is warm and dry.  Psychiatric: She  has a normal mood and affect. Her behavior is normal. Judgment and thought content normal.  Nursing note and vitals reviewed.         Assessment & Plan:   Essential hypertension  Other specified hypothyroidism  Hyperlipidemia, unspecified hyperlipidemia type No Follow-up on file.

## 2017-09-03 NOTE — Assessment & Plan Note (Signed)
Euthyroid. Continue current dose of synthroid. eRx refills sent.

## 2017-09-15 IMAGING — RF DG SWALLOWING FUNCTION
8 series · 13 of 24 positions shown · non-contrast
Comparison: None.

CLINICAL DATA: Dysphagia.

EXAM:
MODIFIED BARIUM SWALLOW
TECHNIQUE: Different consistencies of barium were administered orally to the
patient by the Speech Pathologist. Imaging of the pharynx was
performed in the lateral projection.
FLUOROSCOPY TIME:  Radiation Exposure Index (as provided by the
fluoroscopic device): 5.8 mGy

[Series 1: run · 2 of 102 frames shown (1 of 8)]
[frame 16/102]
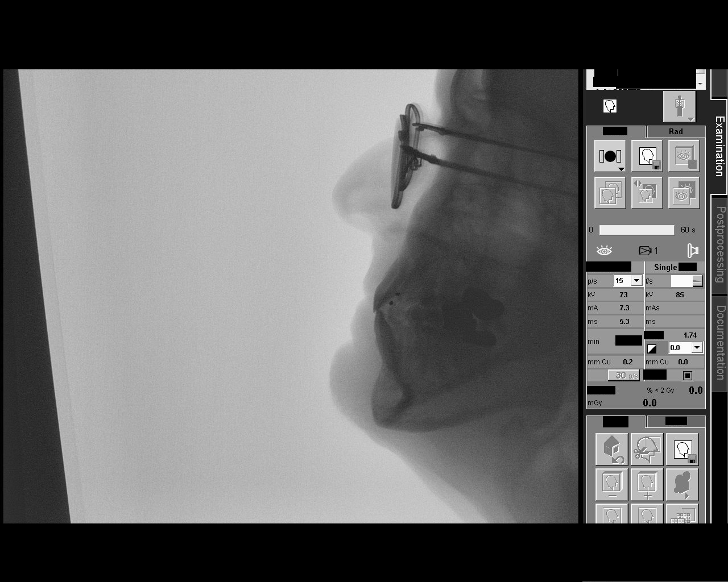
[frame 87/102]
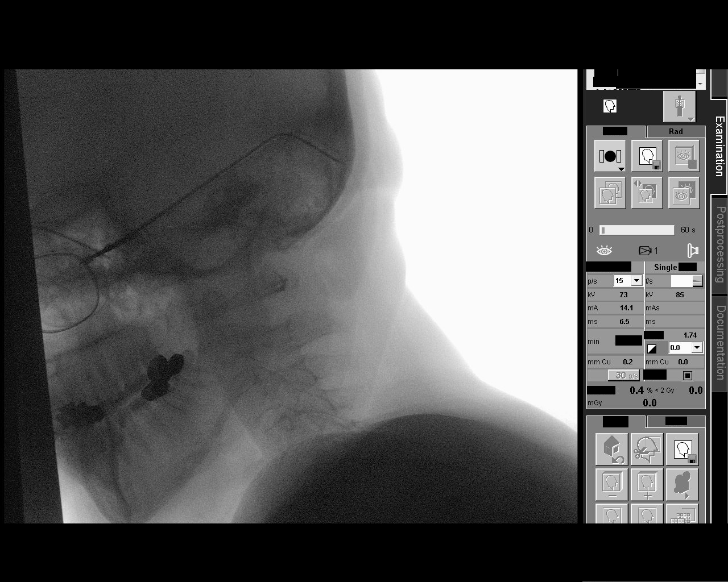

[Series 2: run · 1 of 346 frames shown (2 of 8)]
[frame 77/346]
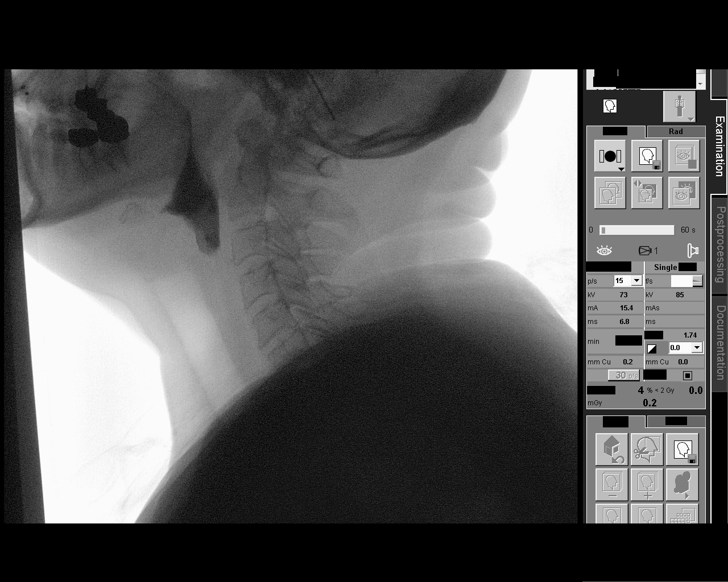

[Series 3: run · 2 of 318 frames shown (3 of 8)]
[frame 5/318]
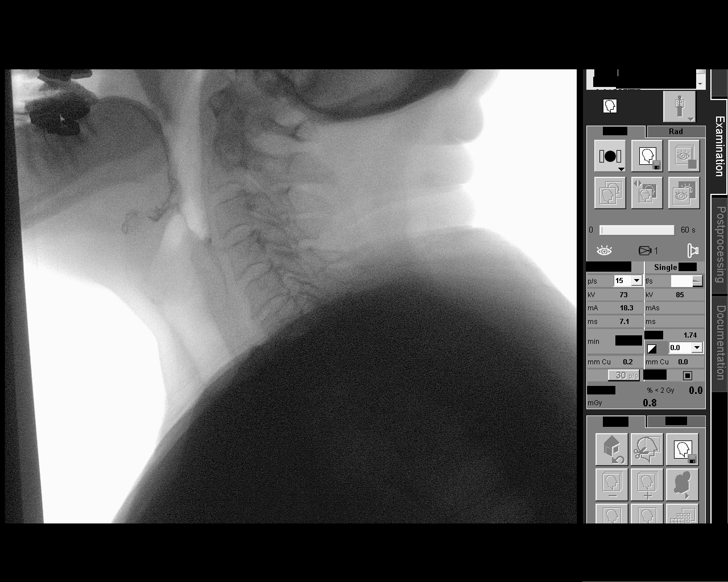
[frame 271/318]
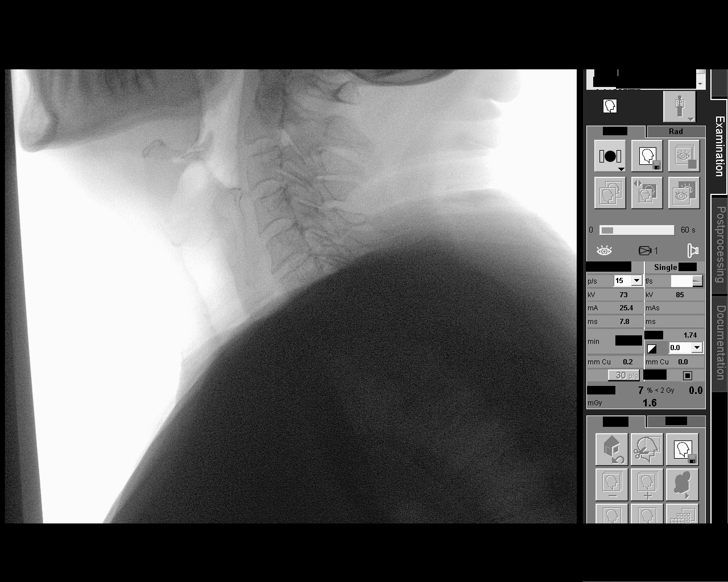

[Series 4: run · 1 of 170 frames shown (4 of 8)]
[frame 80/170]
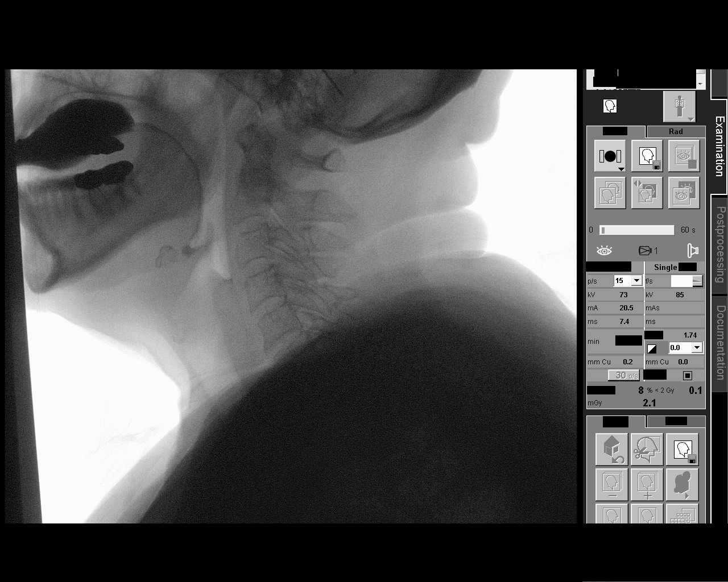

[Series 5: run · 2 of 355 frames shown (5 of 8)]
[frame 19/355]
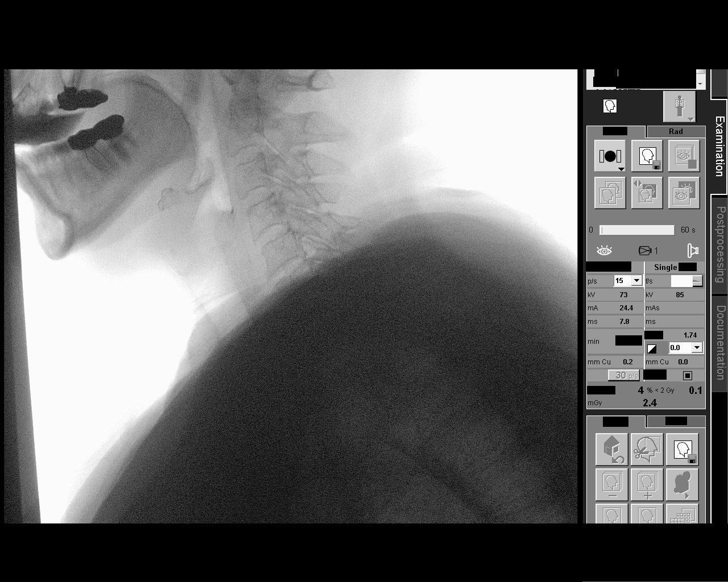
[frame 178/355]
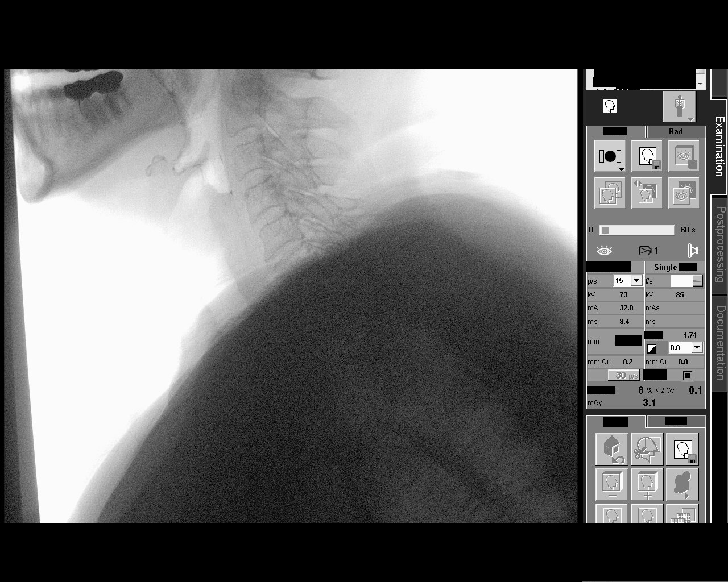

[Series 6: run · 2 of 325 frames shown (6 of 8)]
[frame 49/325]
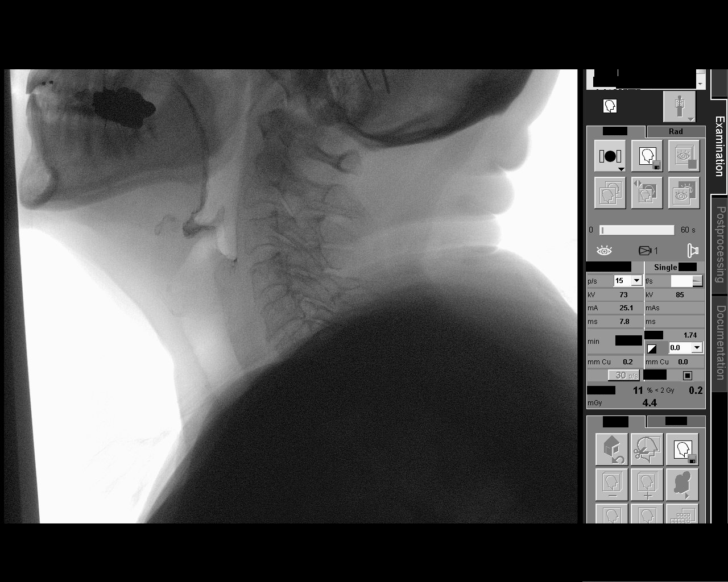
[frame 277/325]
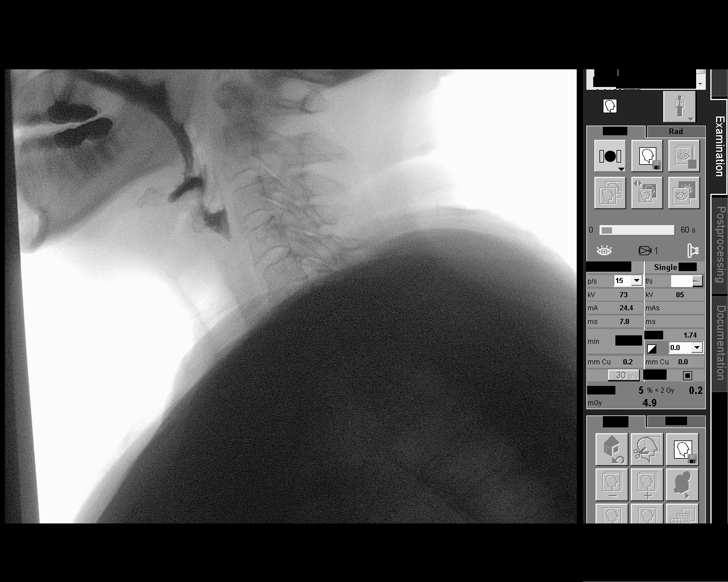

[Series 7: run · 1 of 104 frames shown (7 of 8)]
[frame 53/104]
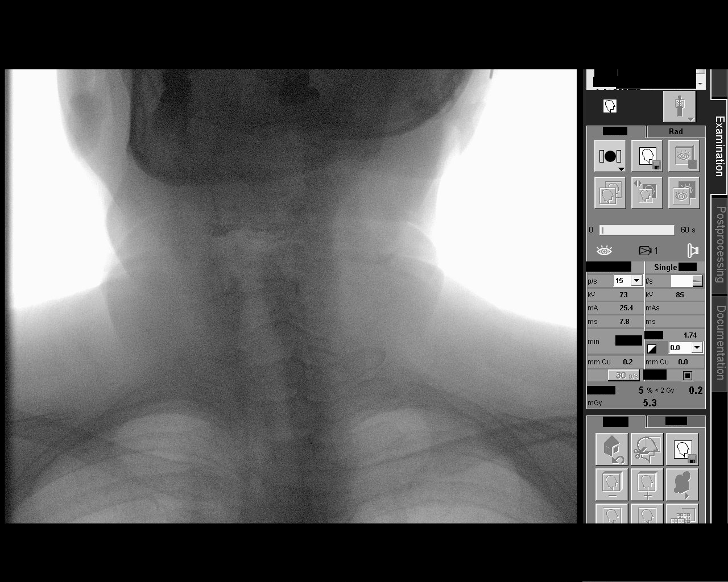

[Series 8: run · 2 of 141 frames shown (8 of 8)]
[frame 19/141]
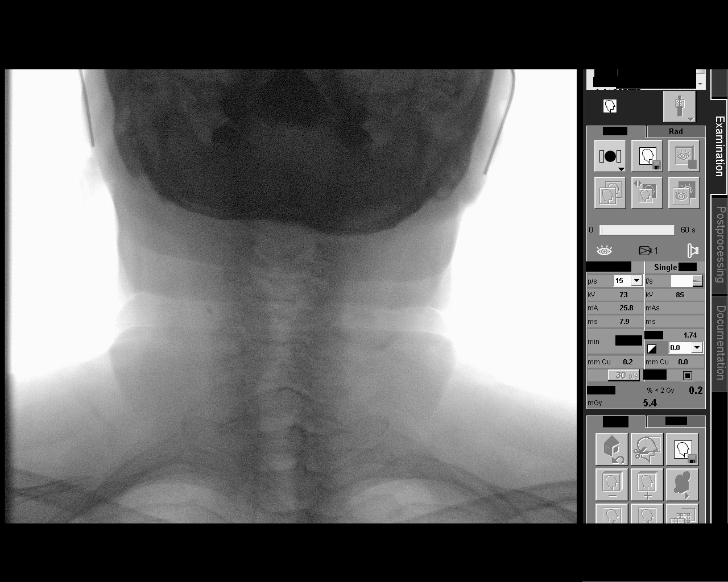
[frame 120/141]
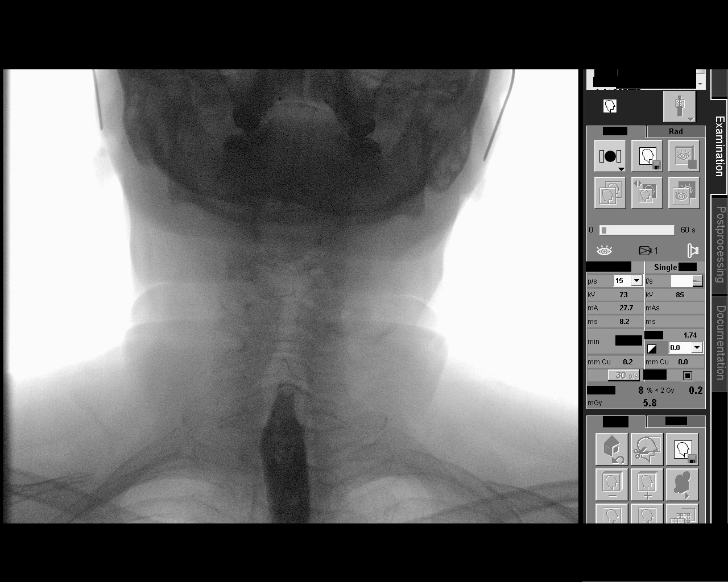

[13 of 24 positions shown; findings below may reference images not displayed]

FINDINGS: Thin liquid- within normal limits

Nectar thick liquid- within normal limits

Wendi Yaneth?Hl within normal limits

Wendi Yaneth?[place] with cracker- within normal limits
IMPRESSION: Normal exam.

Please refer to the Speech Pathologists report for complete details
and recommendations.

## 2017-10-10 ENCOUNTER — Ambulatory Visit
Admission: RE | Admit: 2017-10-10 | Discharge: 2017-10-10 | Disposition: A | Discharge status: Home / Self Care | Payer: PRIVATE HEALTH INSURANCE | Source: Ambulatory Visit | Attending: Obstetrics & Gynecology | Admitting: Obstetrics & Gynecology

## 2017-10-10 DIAGNOSIS — Z01419 Encounter for gynecological examination (general) (routine) without abnormal findings: Secondary | ICD-10-CM

## 2017-10-10 DIAGNOSIS — Z1231 Encounter for screening mammogram for malignant neoplasm of breast: Secondary | ICD-10-CM | POA: Diagnosis not present

## 2017-12-09 DIAGNOSIS — H402231 Chronic angle-closure glaucoma, bilateral, mild stage: Secondary | ICD-10-CM | POA: Diagnosis not present

## 2017-12-11 ENCOUNTER — Other Ambulatory Visit: Payer: Self-pay | Admitting: *Deleted

## 2017-12-11 DIAGNOSIS — N898 Other specified noninflammatory disorders of vagina: Secondary | ICD-10-CM

## 2017-12-11 MED ORDER — ESTRADIOL 10 MCG VA TABS: ORAL_TABLET | VAGINAL | 6 refills | Status: DC

## 2017-12-16 DIAGNOSIS — H40003 Preglaucoma, unspecified, bilateral: Secondary | ICD-10-CM | POA: Diagnosis not present

## 2018-01-31 DIAGNOSIS — E039 Hypothyroidism, unspecified: Secondary | ICD-10-CM | POA: Diagnosis not present

## 2018-01-31 DIAGNOSIS — E782 Mixed hyperlipidemia: Secondary | ICD-10-CM | POA: Diagnosis not present

## 2018-01-31 DIAGNOSIS — K1379 Other lesions of oral mucosa: Secondary | ICD-10-CM | POA: Diagnosis not present

## 2018-01-31 DIAGNOSIS — Z0000 Encounter for general adult medical examination without abnormal findings: Secondary | ICD-10-CM | POA: Diagnosis not present

## 2018-01-31 DIAGNOSIS — I10 Essential (primary) hypertension: Secondary | ICD-10-CM | POA: Diagnosis not present

## 2018-04-04 DIAGNOSIS — I10 Essential (primary) hypertension: Secondary | ICD-10-CM | POA: Diagnosis not present

## 2018-04-04 DIAGNOSIS — N952 Postmenopausal atrophic vaginitis: Secondary | ICD-10-CM | POA: Diagnosis not present

## 2018-04-04 DIAGNOSIS — H409 Unspecified glaucoma: Secondary | ICD-10-CM | POA: Diagnosis not present

## 2018-04-04 DIAGNOSIS — E039 Hypothyroidism, unspecified: Secondary | ICD-10-CM | POA: Diagnosis not present

## 2018-04-04 DIAGNOSIS — E785 Hyperlipidemia, unspecified: Secondary | ICD-10-CM | POA: Diagnosis not present

## 2018-04-04 DIAGNOSIS — R220 Localized swelling, mass and lump, head: Secondary | ICD-10-CM | POA: Diagnosis not present

## 2018-04-04 DIAGNOSIS — N7689 Other specified inflammation of vagina and vulva: Secondary | ICD-10-CM | POA: Diagnosis not present

## 2018-04-04 DIAGNOSIS — G43009 Migraine without aura, not intractable, without status migrainosus: Secondary | ICD-10-CM | POA: Diagnosis not present

## 2018-04-04 DIAGNOSIS — G43909 Migraine, unspecified, not intractable, without status migrainosus: Secondary | ICD-10-CM | POA: Diagnosis not present

## 2018-04-04 DIAGNOSIS — M436 Torticollis: Secondary | ICD-10-CM | POA: Diagnosis not present

## 2018-04-30 ENCOUNTER — Other Ambulatory Visit: Payer: Self-pay | Admitting: *Deleted

## 2018-04-30 DIAGNOSIS — N898 Other specified noninflammatory disorders of vagina: Secondary | ICD-10-CM

## 2018-04-30 MED ORDER — ESTRADIOL 10 MCG VA TABS: ORAL_TABLET | VAGINAL | 6 refills | Status: DC

## 2018-06-27 DIAGNOSIS — K5909 Other constipation: Secondary | ICD-10-CM | POA: Diagnosis not present

## 2018-06-27 DIAGNOSIS — R1013 Epigastric pain: Secondary | ICD-10-CM | POA: Diagnosis not present

## 2018-06-27 DIAGNOSIS — K449 Diaphragmatic hernia without obstruction or gangrene: Secondary | ICD-10-CM | POA: Diagnosis not present

## 2018-06-27 DIAGNOSIS — R1031 Right lower quadrant pain: Secondary | ICD-10-CM | POA: Diagnosis not present

## 2018-07-18 DIAGNOSIS — R194 Change in bowel habit: Secondary | ICD-10-CM | POA: Diagnosis not present

## 2018-07-18 DIAGNOSIS — K760 Fatty (change of) liver, not elsewhere classified: Secondary | ICD-10-CM | POA: Diagnosis not present

## 2018-07-18 DIAGNOSIS — K449 Diaphragmatic hernia without obstruction or gangrene: Secondary | ICD-10-CM | POA: Diagnosis not present

## 2018-07-18 DIAGNOSIS — R160 Hepatomegaly, not elsewhere classified: Secondary | ICD-10-CM | POA: Diagnosis not present

## 2018-07-18 DIAGNOSIS — R1013 Epigastric pain: Secondary | ICD-10-CM | POA: Diagnosis not present

## 2018-07-22 DIAGNOSIS — R9431 Abnormal electrocardiogram [ECG] [EKG]: Secondary | ICD-10-CM | POA: Diagnosis not present

## 2018-07-22 DIAGNOSIS — R1013 Epigastric pain: Secondary | ICD-10-CM | POA: Diagnosis not present

## 2018-07-22 DIAGNOSIS — K5909 Other constipation: Secondary | ICD-10-CM | POA: Diagnosis not present

## 2018-08-01 DIAGNOSIS — H40003 Preglaucoma, unspecified, bilateral: Secondary | ICD-10-CM | POA: Diagnosis not present

## 2018-09-01 DIAGNOSIS — I10 Essential (primary) hypertension: Secondary | ICD-10-CM | POA: Diagnosis not present

## 2018-09-01 DIAGNOSIS — K449 Diaphragmatic hernia without obstruction or gangrene: Secondary | ICD-10-CM | POA: Diagnosis not present

## 2018-09-01 DIAGNOSIS — R109 Unspecified abdominal pain: Secondary | ICD-10-CM | POA: Diagnosis not present

## 2018-09-01 DIAGNOSIS — R194 Change in bowel habit: Secondary | ICD-10-CM | POA: Diagnosis not present

## 2018-09-01 DIAGNOSIS — E039 Hypothyroidism, unspecified: Secondary | ICD-10-CM | POA: Diagnosis not present

## 2018-09-01 DIAGNOSIS — H409 Unspecified glaucoma: Secondary | ICD-10-CM | POA: Diagnosis not present

## 2018-09-01 DIAGNOSIS — M8589 Other specified disorders of bone density and structure, multiple sites: Secondary | ICD-10-CM | POA: Diagnosis not present

## 2018-09-01 DIAGNOSIS — Z23 Encounter for immunization: Secondary | ICD-10-CM | POA: Diagnosis not present

## 2018-09-01 DIAGNOSIS — Z79899 Other long term (current) drug therapy: Secondary | ICD-10-CM | POA: Diagnosis not present

## 2018-09-01 DIAGNOSIS — E785 Hyperlipidemia, unspecified: Secondary | ICD-10-CM | POA: Diagnosis not present

## 2018-09-01 DIAGNOSIS — K760 Fatty (change of) liver, not elsewhere classified: Secondary | ICD-10-CM | POA: Diagnosis not present

## 2018-09-01 DIAGNOSIS — G8929 Other chronic pain: Secondary | ICD-10-CM | POA: Diagnosis not present

## 2018-09-01 DIAGNOSIS — Z1231 Encounter for screening mammogram for malignant neoplasm of breast: Secondary | ICD-10-CM | POA: Diagnosis not present

## 2018-09-15 DIAGNOSIS — Z1231 Encounter for screening mammogram for malignant neoplasm of breast: Secondary | ICD-10-CM | POA: Diagnosis not present

## 2018-10-13 DIAGNOSIS — K640 First degree hemorrhoids: Secondary | ICD-10-CM | POA: Diagnosis not present

## 2018-10-13 DIAGNOSIS — K644 Residual hemorrhoidal skin tags: Secondary | ICD-10-CM | POA: Diagnosis not present

## 2018-10-13 DIAGNOSIS — K3189 Other diseases of stomach and duodenum: Secondary | ICD-10-CM | POA: Diagnosis not present

## 2018-10-13 DIAGNOSIS — D122 Benign neoplasm of ascending colon: Secondary | ICD-10-CM | POA: Diagnosis not present

## 2018-10-13 DIAGNOSIS — K449 Diaphragmatic hernia without obstruction or gangrene: Secondary | ICD-10-CM | POA: Diagnosis not present

## 2018-10-13 DIAGNOSIS — K5730 Diverticulosis of large intestine without perforation or abscess without bleeding: Secondary | ICD-10-CM | POA: Diagnosis not present

## 2018-10-13 DIAGNOSIS — R194 Change in bowel habit: Secondary | ICD-10-CM | POA: Diagnosis not present

## 2018-10-14 DIAGNOSIS — Z1231 Encounter for screening mammogram for malignant neoplasm of breast: Secondary | ICD-10-CM | POA: Diagnosis not present

## 2018-10-27 DIAGNOSIS — E782 Mixed hyperlipidemia: Secondary | ICD-10-CM | POA: Diagnosis not present

## 2018-10-27 DIAGNOSIS — R1013 Epigastric pain: Secondary | ICD-10-CM | POA: Diagnosis not present

## 2018-10-27 DIAGNOSIS — J069 Acute upper respiratory infection, unspecified: Secondary | ICD-10-CM | POA: Diagnosis not present

## 2018-10-27 DIAGNOSIS — I10 Essential (primary) hypertension: Secondary | ICD-10-CM | POA: Diagnosis not present

## 2018-10-27 DIAGNOSIS — Z86010 Personal history of colonic polyps: Secondary | ICD-10-CM | POA: Diagnosis not present

## 2019-02-16 ENCOUNTER — Other Ambulatory Visit: Payer: Self-pay | Admitting: *Deleted

## 2019-02-16 DIAGNOSIS — N898 Other specified noninflammatory disorders of vagina: Secondary | ICD-10-CM

## 2019-02-16 MED ORDER — ESTRADIOL 10 MCG VA TABS
ORAL_TABLET | VAGINAL | 3 refills | Status: AC
Start: 2019-02-16 — End: ?

## 2019-04-15 ENCOUNTER — Telehealth: Payer: Self-pay | Admitting: Family Medicine

## 2019-04-15 NOTE — Telephone Encounter (Signed)
I called and left message on patient voicemail to call office and schedule CPE.

## 2024-02-11 ENCOUNTER — Ambulatory Visit: Payer: Medicare (Managed Care) | Admitting: Podiatry

## 2024-02-25 ENCOUNTER — Ambulatory Visit: Payer: Medicare (Managed Care) | Admitting: Podiatry
